# Patient Record
Sex: Male | Born: 1955 | Race: White | Hispanic: No | Marital: Single | Smoking: Former smoker
Health system: Southern US, Community
[De-identification: ages and names within clinical notes are randomized; demographics above are authoritative.]

## PROBLEM LIST (undated history)

## (undated) DIAGNOSIS — M3500 Sicca syndrome, unspecified: Secondary | ICD-10-CM

## (undated) DIAGNOSIS — Z8619 Personal history of other infectious and parasitic diseases: Secondary | ICD-10-CM

## (undated) DIAGNOSIS — I25709 Atherosclerosis of coronary artery bypass graft(s), unspecified, with unspecified angina pectoris: Secondary | ICD-10-CM

## (undated) DIAGNOSIS — F209 Schizophrenia, unspecified: Secondary | ICD-10-CM

## (undated) DIAGNOSIS — R079 Chest pain, unspecified: Secondary | ICD-10-CM

## (undated) DIAGNOSIS — F319 Bipolar disorder, unspecified: Secondary | ICD-10-CM

## (undated) DIAGNOSIS — F909 Attention-deficit hyperactivity disorder, unspecified type: Secondary | ICD-10-CM

## (undated) DIAGNOSIS — E039 Hypothyroidism, unspecified: Secondary | ICD-10-CM

## (undated) DIAGNOSIS — K219 Gastro-esophageal reflux disease without esophagitis: Secondary | ICD-10-CM

## (undated) DIAGNOSIS — Z0000 Encounter for general adult medical examination without abnormal findings: Secondary | ICD-10-CM

## (undated) DIAGNOSIS — D509 Iron deficiency anemia, unspecified: Secondary | ICD-10-CM

## (undated) HISTORY — PX: OTHER SURGICAL HISTORY: SHX169

## (undated) HISTORY — PX: HERNIA REPAIR: SHX51

---

## 1997-07-26 ENCOUNTER — Emergency Department (HOSPITAL_COMMUNITY): Admission: EM | Admit: 1997-07-26 | Discharge: 1997-07-26 | Payer: Self-pay | Admitting: Emergency Medicine

## 1998-05-28 ENCOUNTER — Emergency Department (HOSPITAL_COMMUNITY): Admission: EM | Admit: 1998-05-28 | Discharge: 1998-05-28 | Payer: Self-pay

## 1998-11-18 ENCOUNTER — Emergency Department (HOSPITAL_COMMUNITY): Admission: EM | Admit: 1998-11-18 | Discharge: 1998-11-18 | Payer: Self-pay | Admitting: Emergency Medicine

## 1999-06-15 ENCOUNTER — Emergency Department (HOSPITAL_COMMUNITY): Admission: EM | Admit: 1999-06-15 | Discharge: 1999-06-15 | Payer: Self-pay | Admitting: Emergency Medicine

## 1999-12-01 ENCOUNTER — Emergency Department (HOSPITAL_COMMUNITY): Admission: EM | Admit: 1999-12-01 | Discharge: 1999-12-01 | Payer: Self-pay | Admitting: *Deleted

## 2000-08-09 ENCOUNTER — Emergency Department (HOSPITAL_COMMUNITY): Admission: EM | Admit: 2000-08-09 | Discharge: 2000-08-09 | Payer: Self-pay | Admitting: Emergency Medicine

## 2000-09-13 ENCOUNTER — Emergency Department (HOSPITAL_COMMUNITY): Admission: EM | Admit: 2000-09-13 | Discharge: 2000-09-13 | Payer: Self-pay | Admitting: Emergency Medicine

## 2002-05-09 ENCOUNTER — Encounter: Payer: Self-pay | Admitting: Emergency Medicine

## 2002-05-09 ENCOUNTER — Emergency Department (HOSPITAL_COMMUNITY): Admission: EM | Admit: 2002-05-09 | Discharge: 2002-05-09 | Payer: Self-pay | Admitting: Emergency Medicine

## 2002-05-14 ENCOUNTER — Emergency Department (HOSPITAL_COMMUNITY): Admission: EM | Admit: 2002-05-14 | Discharge: 2002-05-14 | Payer: Self-pay | Admitting: *Deleted

## 2002-05-18 ENCOUNTER — Emergency Department (HOSPITAL_COMMUNITY): Admission: EM | Admit: 2002-05-18 | Discharge: 2002-05-18 | Payer: Self-pay | Admitting: Emergency Medicine

## 2004-10-22 ENCOUNTER — Emergency Department (HOSPITAL_COMMUNITY): Admission: EM | Admit: 2004-10-22 | Discharge: 2004-10-22 | Payer: Self-pay | Admitting: Emergency Medicine

## 2005-01-31 ENCOUNTER — Emergency Department (HOSPITAL_COMMUNITY): Admission: EM | Admit: 2005-01-31 | Discharge: 2005-01-31 | Payer: Self-pay | Admitting: Emergency Medicine

## 2005-02-15 ENCOUNTER — Emergency Department (HOSPITAL_COMMUNITY): Admission: EM | Admit: 2005-02-15 | Discharge: 2005-02-15 | Payer: Self-pay | Admitting: Emergency Medicine

## 2005-04-25 ENCOUNTER — Emergency Department (HOSPITAL_COMMUNITY): Admission: EM | Admit: 2005-04-25 | Discharge: 2005-04-25 | Payer: Self-pay | Admitting: Emergency Medicine

## 2005-07-13 ENCOUNTER — Emergency Department (HOSPITAL_COMMUNITY): Admission: EM | Admit: 2005-07-13 | Discharge: 2005-07-13 | Payer: Self-pay | Admitting: Emergency Medicine

## 2005-07-24 ENCOUNTER — Emergency Department (HOSPITAL_COMMUNITY): Admission: EM | Admit: 2005-07-24 | Discharge: 2005-07-24 | Payer: Self-pay | Admitting: Emergency Medicine

## 2005-08-05 ENCOUNTER — Emergency Department (HOSPITAL_COMMUNITY): Admission: EM | Admit: 2005-08-05 | Discharge: 2005-08-05 | Payer: Self-pay | Admitting: Emergency Medicine

## 2005-09-07 ENCOUNTER — Emergency Department (HOSPITAL_COMMUNITY): Admission: EM | Admit: 2005-09-07 | Discharge: 2005-09-07 | Payer: Self-pay | Admitting: Emergency Medicine

## 2006-05-31 ENCOUNTER — Emergency Department (HOSPITAL_COMMUNITY): Admission: EM | Admit: 2006-05-31 | Discharge: 2006-05-31 | Payer: Self-pay | Admitting: Emergency Medicine

## 2006-06-21 ENCOUNTER — Emergency Department (HOSPITAL_COMMUNITY): Admission: EM | Admit: 2006-06-21 | Discharge: 2006-06-21 | Payer: Self-pay | Admitting: Emergency Medicine

## 2006-08-04 ENCOUNTER — Emergency Department (HOSPITAL_COMMUNITY): Admission: EM | Admit: 2006-08-04 | Discharge: 2006-08-05 | Payer: Self-pay | Admitting: Emergency Medicine

## 2006-09-17 ENCOUNTER — Emergency Department (HOSPITAL_COMMUNITY): Admission: EM | Admit: 2006-09-17 | Discharge: 2006-09-17 | Payer: Self-pay | Admitting: Emergency Medicine

## 2006-09-28 ENCOUNTER — Emergency Department (HOSPITAL_COMMUNITY): Admission: EM | Admit: 2006-09-28 | Discharge: 2006-09-28 | Payer: Self-pay | Admitting: Emergency Medicine

## 2006-10-06 ENCOUNTER — Emergency Department (HOSPITAL_COMMUNITY): Admission: EM | Admit: 2006-10-06 | Discharge: 2006-10-06 | Payer: Self-pay | Admitting: Emergency Medicine

## 2006-10-13 ENCOUNTER — Emergency Department (HOSPITAL_COMMUNITY): Admission: EM | Admit: 2006-10-13 | Discharge: 2006-10-13 | Payer: Self-pay | Admitting: Emergency Medicine

## 2006-10-17 ENCOUNTER — Emergency Department (HOSPITAL_COMMUNITY): Admission: EM | Admit: 2006-10-17 | Discharge: 2006-10-17 | Payer: Self-pay | Admitting: Emergency Medicine

## 2006-10-25 ENCOUNTER — Emergency Department: Payer: Self-pay | Admitting: Emergency Medicine

## 2006-11-09 ENCOUNTER — Emergency Department (HOSPITAL_COMMUNITY): Admission: EM | Admit: 2006-11-09 | Discharge: 2006-11-09 | Payer: Self-pay | Admitting: Emergency Medicine

## 2006-11-15 ENCOUNTER — Emergency Department (HOSPITAL_COMMUNITY): Admission: EM | Admit: 2006-11-15 | Discharge: 2006-11-15 | Payer: Self-pay | Admitting: Emergency Medicine

## 2006-11-26 ENCOUNTER — Emergency Department (HOSPITAL_COMMUNITY): Admission: EM | Admit: 2006-11-26 | Discharge: 2006-11-26 | Payer: Self-pay | Admitting: Emergency Medicine

## 2006-11-30 ENCOUNTER — Emergency Department (HOSPITAL_COMMUNITY): Admission: EM | Admit: 2006-11-30 | Discharge: 2006-12-01 | Payer: Self-pay | Admitting: Emergency Medicine

## 2006-12-08 ENCOUNTER — Emergency Department (HOSPITAL_COMMUNITY): Admission: EM | Admit: 2006-12-08 | Discharge: 2006-12-08 | Payer: Self-pay | Admitting: Emergency Medicine

## 2006-12-09 ENCOUNTER — Emergency Department: Payer: Self-pay | Admitting: Emergency Medicine

## 2006-12-18 ENCOUNTER — Emergency Department (HOSPITAL_COMMUNITY): Admission: EM | Admit: 2006-12-18 | Discharge: 2006-12-18 | Payer: Self-pay | Admitting: Emergency Medicine

## 2007-01-04 ENCOUNTER — Encounter: Payer: Self-pay | Admitting: Cardiology

## 2007-01-05 ENCOUNTER — Inpatient Hospital Stay (HOSPITAL_COMMUNITY): Admission: EM | Admit: 2007-01-05 | Discharge: 2007-01-07 | Payer: Self-pay | Admitting: Cardiology

## 2007-01-09 ENCOUNTER — Emergency Department (HOSPITAL_COMMUNITY): Admission: EM | Admit: 2007-01-09 | Discharge: 2007-01-09 | Payer: Self-pay | Admitting: Emergency Medicine

## 2007-01-15 ENCOUNTER — Emergency Department (HOSPITAL_COMMUNITY): Admission: EM | Admit: 2007-01-15 | Discharge: 2007-01-15 | Payer: Self-pay | Admitting: Emergency Medicine

## 2007-01-25 ENCOUNTER — Emergency Department (HOSPITAL_COMMUNITY): Admission: EM | Admit: 2007-01-25 | Discharge: 2007-01-26 | Payer: Self-pay | Admitting: Emergency Medicine

## 2007-01-28 ENCOUNTER — Emergency Department (HOSPITAL_COMMUNITY): Admission: EM | Admit: 2007-01-28 | Discharge: 2007-01-29 | Payer: Self-pay | Admitting: Emergency Medicine

## 2007-02-02 ENCOUNTER — Emergency Department (HOSPITAL_COMMUNITY): Admission: EM | Admit: 2007-02-02 | Discharge: 2007-02-02 | Payer: Self-pay | Admitting: Emergency Medicine

## 2007-02-04 ENCOUNTER — Emergency Department (HOSPITAL_COMMUNITY): Admission: EM | Admit: 2007-02-04 | Discharge: 2007-02-05 | Payer: Self-pay | Admitting: Emergency Medicine

## 2007-02-18 ENCOUNTER — Emergency Department (HOSPITAL_COMMUNITY): Admission: EM | Admit: 2007-02-18 | Discharge: 2007-02-18 | Payer: Self-pay | Admitting: Emergency Medicine

## 2007-03-20 ENCOUNTER — Emergency Department (HOSPITAL_COMMUNITY): Admission: EM | Admit: 2007-03-20 | Discharge: 2007-03-20 | Payer: Self-pay | Admitting: Emergency Medicine

## 2008-03-24 ENCOUNTER — Emergency Department: Payer: Self-pay | Admitting: Unknown Physician Specialty

## 2008-04-05 ENCOUNTER — Emergency Department: Payer: Self-pay | Admitting: Emergency Medicine

## 2008-11-08 IMAGING — CR DG CHEST 1V PORT
1 series · 1 of 1 positions shown · non-contrast
Comparison: 01/04/07.

CLINICAL DATA: Chest pain.  
 PORTABLE CHEST - 1 VIEW 01/04/07:

[view not recorded]
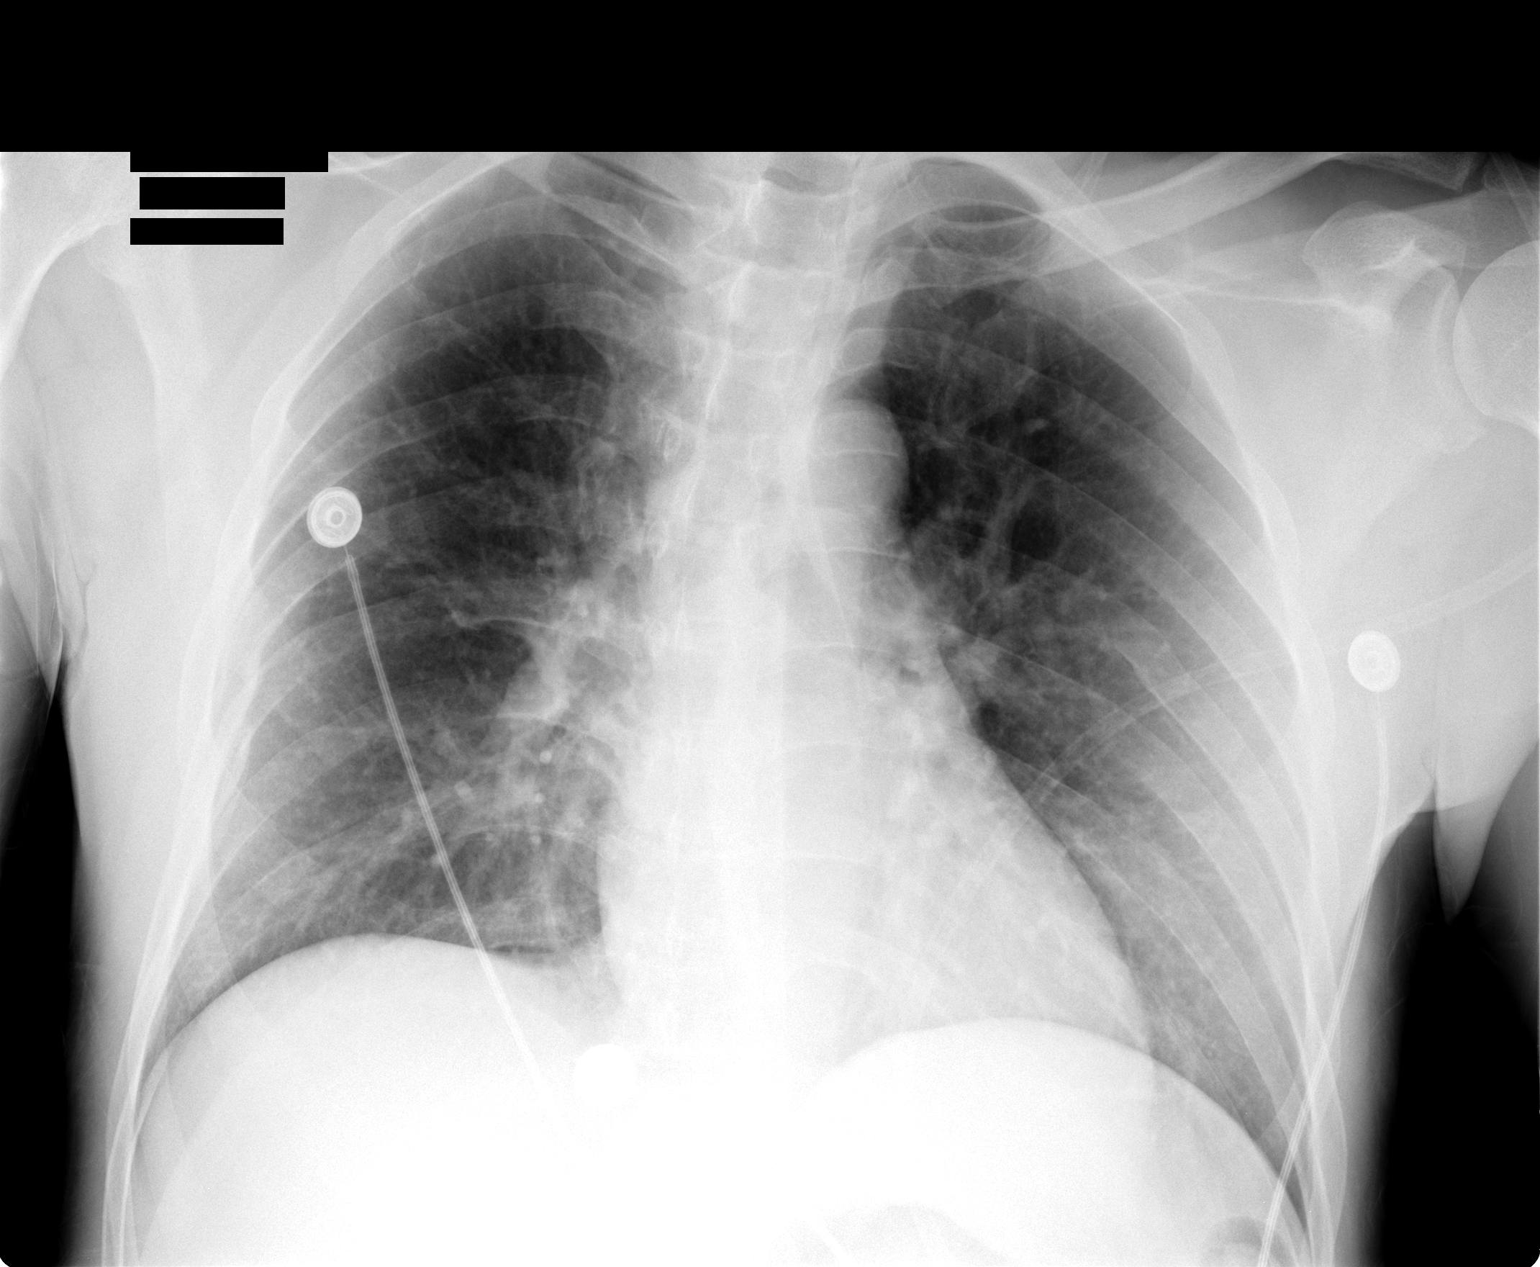

[1 of 1 positions shown; findings below may reference images not displayed]

FINDINGS: Lungs are clear.  Heart size normal.  No effusion or focal bony abnormality.
IMPRESSION: No acute disease.

## 2008-11-08 IMAGING — CT CT ANGIO CHEST
2 of 4 series · 19 of 36 positions shown · IV contrast (APPLIED)
Comparison: Plain film of 01/04/07.

CLINICAL DATA: Chest pain, shortness of breath.
 CT ANGIOGRAPHY OF CHEST:
TECHNIQUE: Multidetector CT imaging of the chest was performed during bolus injection of intravenous contrast.  Multiplanar CT angiographic image reconstructions were generated to evaluate the vascular anatomy.
 Contrast:  80 cc of Omnipaque 300.

[Series 7: pe 1.0 b40f thins for pacs · axial · 0.62mm/px · z∈[-290,-36]mm · 16 of 284 slices shown]
[im 15/284  lung]
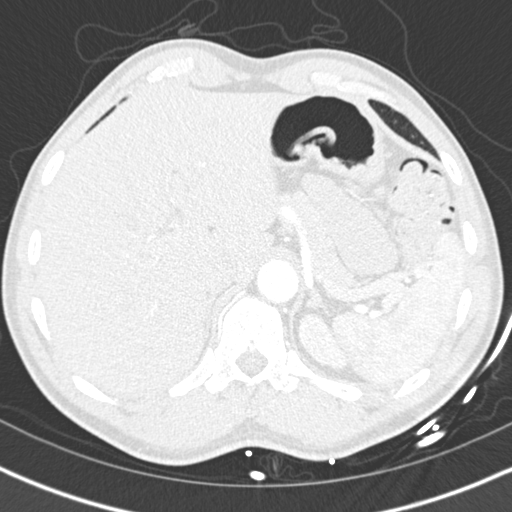
[im 29/284  mediastinal]
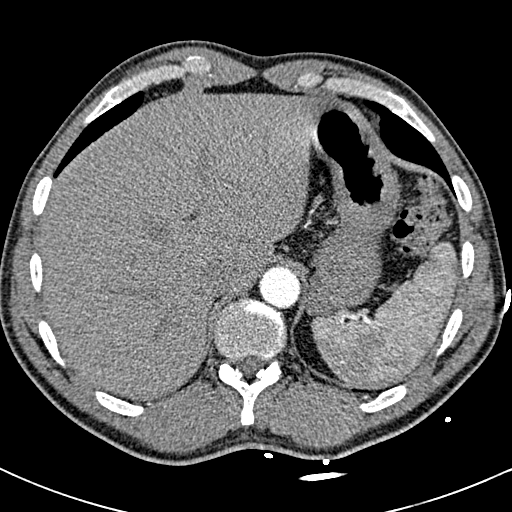
[im 43/284  lung]
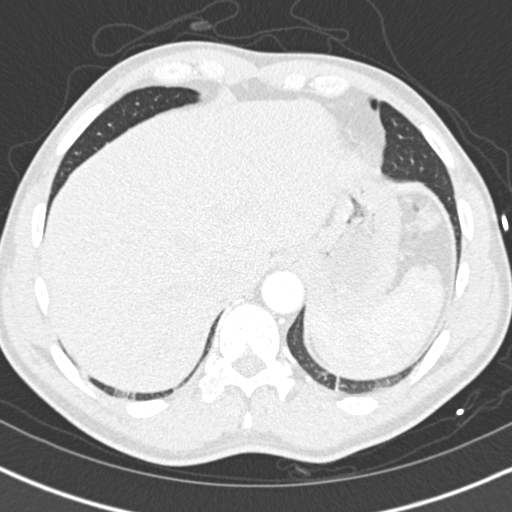
[im 71/284  mediastinal]
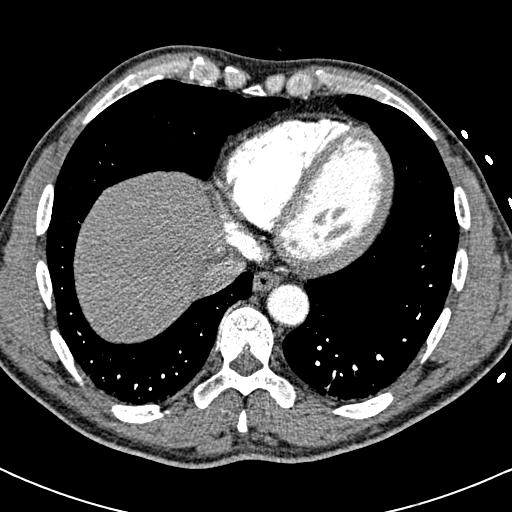
[im 85/284  lung]
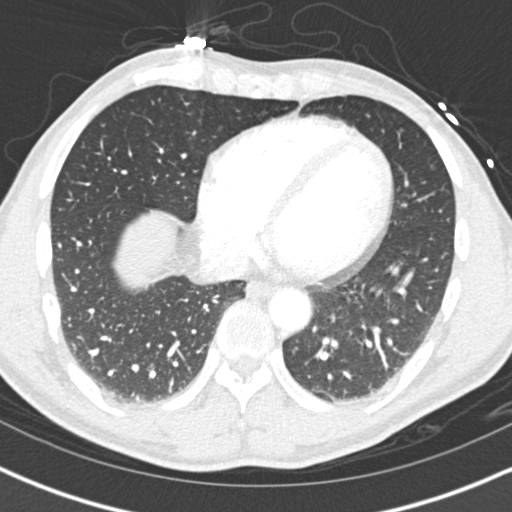
[im 100/284  mediastinal]
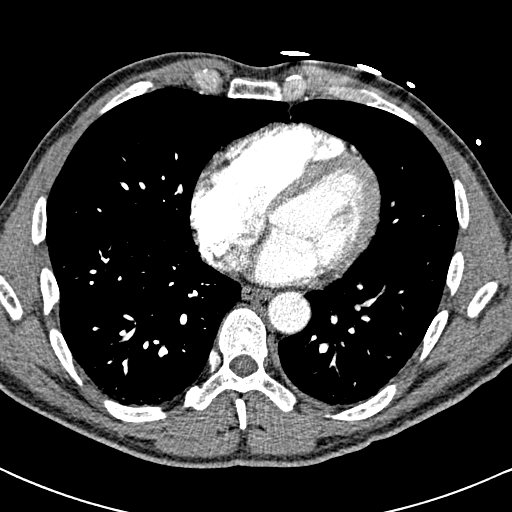
[im 114/284  lung]
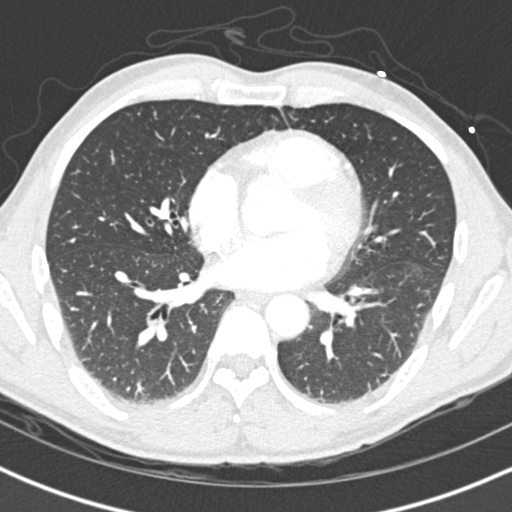
[im 128/284  mediastinal]
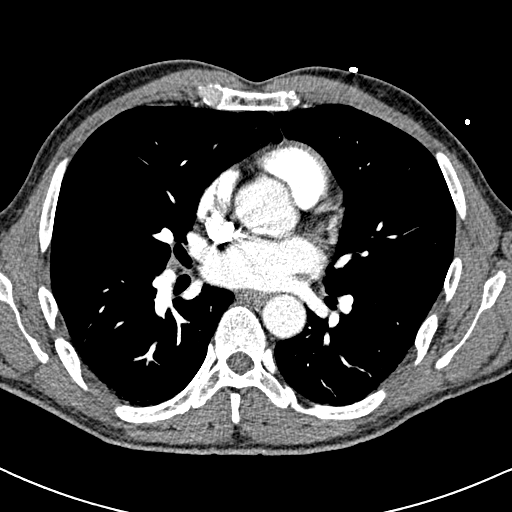
[im 156/284  lung]
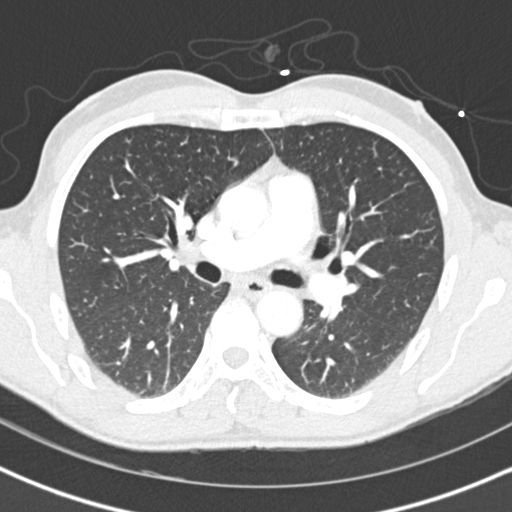
[im 170/284  mediastinal]
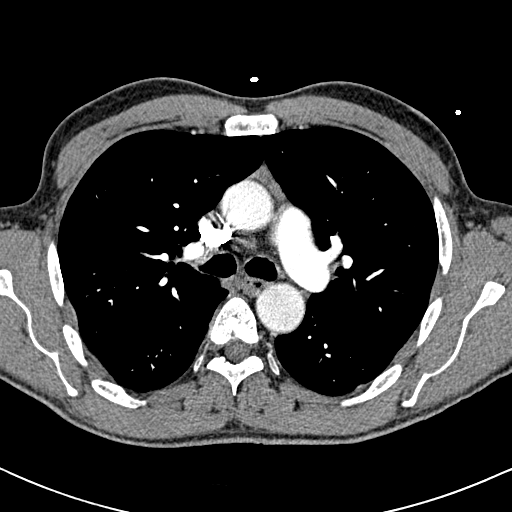
[im 184/284  lung]
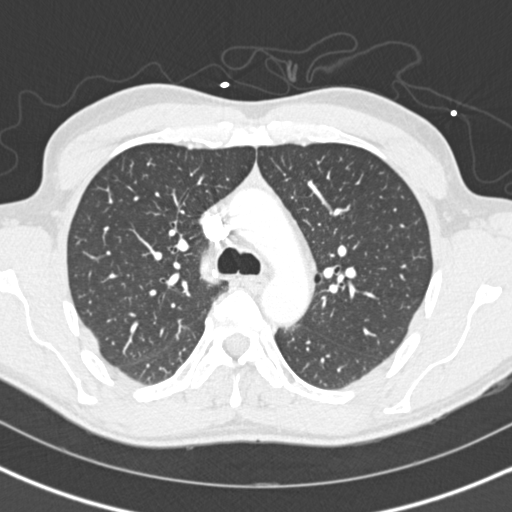
[im 199/284  mediastinal]
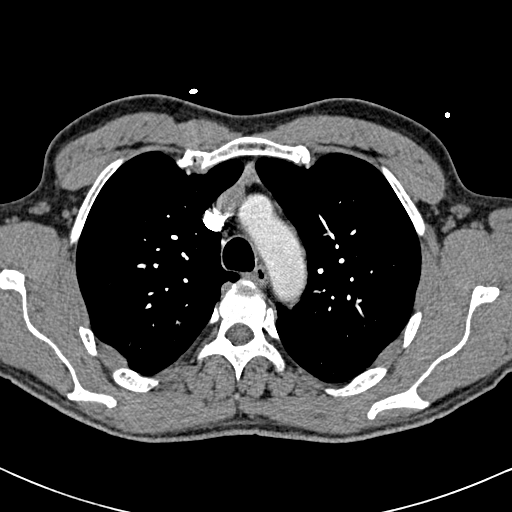
[im 213/284  lung]
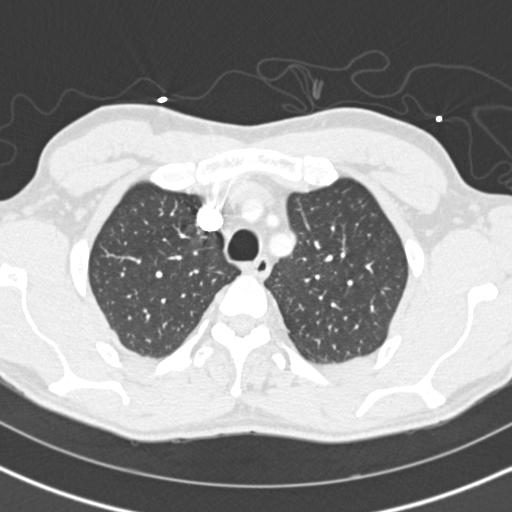
[im 241/284  mediastinal]
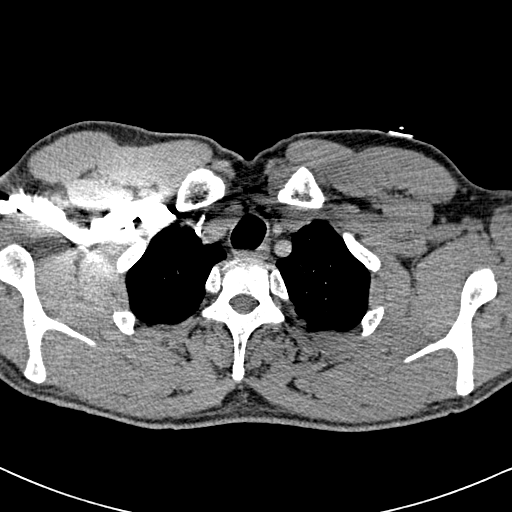
[im 255/284  lung]
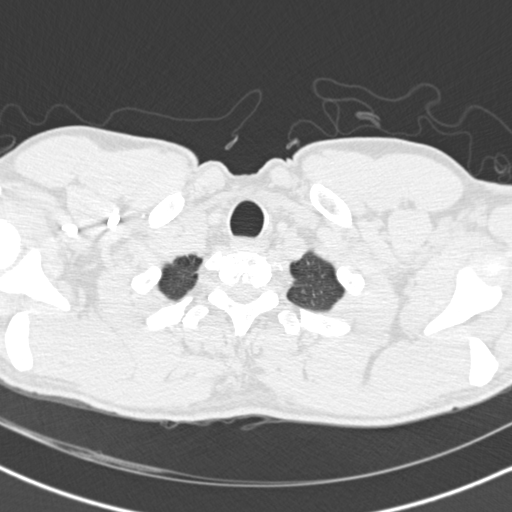
[im 269/284  mediastinal]
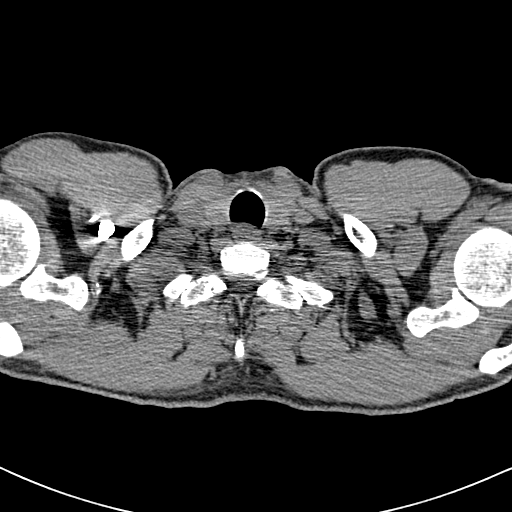

[Series 602: <mpr range> · coronal · 0.62mm/px · 3 of 113 slices shown]
[im 23/113  mediastinal]
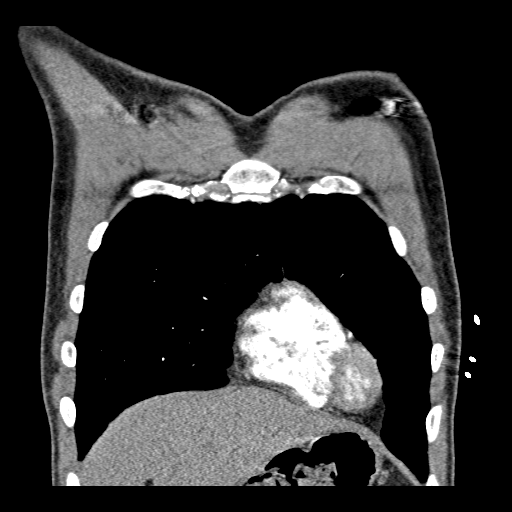
[im 45/113  mediastinal]
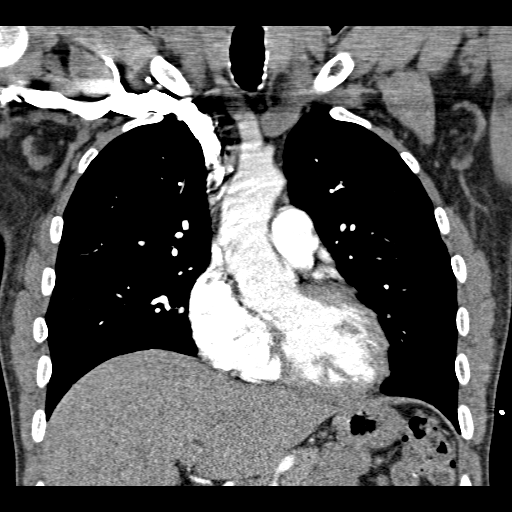
[im 68/113  mediastinal]
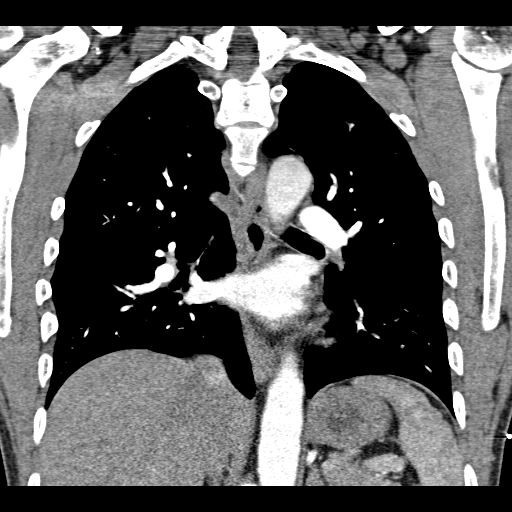

[19 of 36 positions shown; findings below may reference images not displayed]

FINDINGS: The study is technically good.  There is no CT evidence of pulmonary embolus.  No axillary, hilar or mediastinal lymphadenopathy.  No pleural or pericardial effusion.  The heart and great vessels appear normal.  No aortic dissection or aneurysm is noted.  No notable atherosclerotic vascular disease.  The lungs demonstrate only mild dependent atelectatic change.  Incidentally imaged upper abdomen is unremarkable.  No focal bony abnormality.
IMPRESSION: 1. Negative chest CT.  
 2. No acute findings.
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
## 2010-11-13 LAB — CBC
MCHC: 34.5
MCV: 94.5
Platelets: 260
RDW: 13.3

## 2010-11-13 LAB — COMPREHENSIVE METABOLIC PANEL
ALT: 17
AST: 29
Albumin: 3.7
CO2: 26
Creatinine, Ser: 1.18
GFR calc Af Amer: >60
GFR calc non Af Amer: >60
Glucose, Bld: 89
Potassium: 3.8
Sodium: 140
Total Bilirubin: 0.9
Total Protein: 6.2

## 2010-11-13 LAB — DIFFERENTIAL
Eosinophils Absolute: 0.1
Monocytes Relative: 7

## 2010-11-13 LAB — URINALYSIS, ROUTINE W REFLEX MICROSCOPIC
Glucose, UA: NEGATIVE
Hgb urine dipstick: NEGATIVE
Urobilinogen, UA: 1
pH: 5.5

## 2010-11-28 LAB — DIFFERENTIAL
Basophils Absolute: 0
Lymphocytes Relative: 44
Lymphs Abs: 3
Monocytes Absolute: 0.4
Monocytes Relative: 6
Neutro Abs: 3.1

## 2010-11-28 LAB — BASIC METABOLIC PANEL
Calcium: 9.3
GFR calc Af Amer: >60
GFR calc non Af Amer: >60
Potassium: 3.7
Sodium: 143

## 2010-11-28 LAB — CBC
HCT: 42.1
Hemoglobin: 14.5
RBC: 4.41
RDW: 13.2
WBC: 6.9

## 2010-11-28 LAB — URINALYSIS, ROUTINE W REFLEX MICROSCOPIC
Glucose, UA: NEGATIVE
Hgb urine dipstick: NEGATIVE
Ketones, ur: NEGATIVE
Protein, ur: NEGATIVE
Urobilinogen, UA: 0.2

## 2010-11-28 LAB — RAPID URINE DRUG SCREEN, HOSP PERFORMED
Amphetamines: NOT DETECTED
Benzodiazepines: POSITIVE — AB
Cocaine: POSITIVE — AB

## 2010-12-01 LAB — CBC
HCT: 42.1
Hemoglobin: 14.8
MCHC: 35
MCV: 95.3
Platelets: 277
RBC: 4.42
RDW: 13.2
WBC: 4.7

## 2010-12-01 LAB — DIFFERENTIAL
Basophils Absolute: 0
Basophils Relative: 1
Eosinophils Absolute: 0.1 — ABNORMAL LOW
Eosinophils Relative: 2
Lymphocytes Relative: 32
Lymphs Abs: 1.5
Monocytes Absolute: 0.4
Monocytes Relative: 8
Neutro Abs: 2.7
Neutrophils Relative %: 57

## 2010-12-01 LAB — BASIC METABOLIC PANEL WITH GFR
CO2: 28
Calcium: 9.8
Creatinine, Ser: 1.02
GFR calc Af Amer: >60
GFR calc non Af Amer: >60
Sodium: 143

## 2010-12-01 LAB — BASIC METABOLIC PANEL
BUN: 10
Chloride: 106
Glucose, Bld: 124 — ABNORMAL HIGH
Potassium: 3.4 — ABNORMAL LOW

## 2010-12-01 LAB — URINALYSIS, ROUTINE W REFLEX MICROSCOPIC
Bilirubin Urine: NEGATIVE
Glucose, UA: NEGATIVE
Ketones, ur: NEGATIVE
pH: 7.5

## 2010-12-01 LAB — POCT CARDIAC MARKERS
CKMB, poc: 1
Troponin i, poc: <0.05
Troponin i, poc: <0.05

## 2010-12-01 LAB — D-DIMER, QUANTITATIVE: D-Dimer, Quant: 1.1 — ABNORMAL HIGH

## 2010-12-02 LAB — CBC
HCT: 37.8 — ABNORMAL LOW
Hemoglobin: 13
Hemoglobin: 14.6
Hemoglobin: 13.4
Hemoglobin: 14.6
MCHC: 34
RBC: 3.93 — ABNORMAL LOW
RBC: 4.39
RBC: 4.06 — ABNORMAL LOW
RBC: 4.42
RDW: 13.4
RDW: 13.4
RDW: 13.4
RDW: 13.5
WBC: 4.8

## 2010-12-02 LAB — CSF CELL COUNT WITH DIFFERENTIAL
RBC Count, CSF: 0
Tube #: 4
WBC, CSF: 1

## 2010-12-02 LAB — TROPONIN I
Troponin I: 0.02
Troponin I: <0.01

## 2010-12-02 LAB — DIFFERENTIAL
Basophils Absolute: 0
Basophils Relative: 1
Eosinophils Relative: 2
Lymphocytes Relative: 20
Lymphocytes Relative: 27
Lymphocytes Relative: 29
Lymphs Abs: 2.2
Lymphs Abs: 1.5
Monocytes Absolute: 0.3
Monocytes Absolute: 0.5
Monocytes Absolute: 0.3
Monocytes Relative: 6
Monocytes Relative: 6
Neutro Abs: 4.3
Neutro Abs: 3.3

## 2010-12-02 LAB — I-STAT 8, (EC8 V) (CONVERTED LAB)
BUN: 15
Chloride: 106
Glucose, Bld: 85
Hemoglobin: 13.3
Potassium: 3.6
Sodium: 143

## 2010-12-02 LAB — CSF CULTURE W GRAM STAIN: Culture: NO GROWTH

## 2010-12-02 LAB — CK TOTAL AND CKMB (NOT AT ARMC)
CK, MB: 1.5
CK, MB: 1.8
CK, MB: 1.5
Relative Index: 1.5
Relative Index: INVALID
Total CK: 68
Total CK: 101
Total CK: 81

## 2010-12-02 LAB — COMPREHENSIVE METABOLIC PANEL
Albumin: 4
Alkaline Phosphatase: 53
BUN: 10
Calcium: 9.2
Potassium: 4
Sodium: 137
Total Protein: 6.6

## 2010-12-02 LAB — PROTEIN, CSF: Total  Protein, CSF: 53 — ABNORMAL HIGH

## 2010-12-02 LAB — LIPID PANEL
Cholesterol: 158
LDL Cholesterol: 105 — ABNORMAL HIGH
Triglycerides: 85

## 2010-12-02 LAB — BASIC METABOLIC PANEL
Calcium: 8.6
Creatinine, Ser: 1.01
GFR calc Af Amer: >60
GFR calc non Af Amer: >60
Glucose, Bld: 101 — ABNORMAL HIGH
Sodium: 138

## 2010-12-02 LAB — APTT: aPTT: 31

## 2010-12-02 LAB — PROTIME-INR
INR: 0.9
Prothrombin Time: 12.8

## 2010-12-02 LAB — C-REACTIVE PROTEIN: CRP: 0 — ABNORMAL LOW (ref <0.6)

## 2010-12-02 LAB — HEPARIN LEVEL (UNFRACTIONATED): Heparin Unfractionated: 0.73 — ABNORMAL HIGH

## 2010-12-04 LAB — BASIC METABOLIC PANEL
BUN: 12
CO2: 23
Calcium: 8.9
Chloride: 106
Creatinine, Ser: 1.06
Glucose, Bld: 170 — ABNORMAL HIGH

## 2010-12-04 LAB — CBC
MCHC: 35
MCV: 93.9
RDW: 13.3

## 2010-12-04 LAB — DIFFERENTIAL
Basophils Absolute: 0
Basophils Relative: 1
Eosinophils Absolute: 0.1
Monocytes Relative: 6
Neutro Abs: 2.4
Neutrophils Relative %: 56

## 2010-12-04 LAB — POCT CARDIAC MARKERS
CKMB, poc: 3
Myoglobin, poc: 103

## 2010-12-11 LAB — URINALYSIS, ROUTINE W REFLEX MICROSCOPIC
Bilirubin Urine: NEGATIVE
Glucose, UA: NEGATIVE
Hgb urine dipstick: NEGATIVE
Protein, ur: NEGATIVE
Specific Gravity, Urine: 1.015
Urobilinogen, UA: 0.2

## 2015-05-05 ENCOUNTER — Encounter: Payer: Self-pay | Admitting: Emergency Medicine

## 2015-05-05 ENCOUNTER — Emergency Department
Admission: EM | Admit: 2015-05-05 | Discharge: 2015-05-05 | Disposition: A | Discharge status: Home / Self Care | Attending: Emergency Medicine | Admitting: Emergency Medicine

## 2015-05-05 ENCOUNTER — Emergency Department

## 2015-05-05 DIAGNOSIS — R079 Chest pain, unspecified: Secondary | ICD-10-CM

## 2015-05-05 DIAGNOSIS — F329 Major depressive disorder, single episode, unspecified: Secondary | ICD-10-CM | POA: Diagnosis not present

## 2015-05-05 DIAGNOSIS — Z9861 Coronary angioplasty status: Secondary | ICD-10-CM | POA: Diagnosis not present

## 2015-05-05 HISTORY — DX: Attention-deficit hyperactivity disorder, unspecified type: F90.9

## 2015-05-05 HISTORY — DX: Chest pain, unspecified: R07.9

## 2015-05-05 HISTORY — DX: Schizophrenia, unspecified: F20.9

## 2015-05-05 HISTORY — DX: Bipolar disorder, unspecified: F31.9

## 2015-05-05 LAB — CBC
HCT: 36.4 % — ABNORMAL LOW (ref 40.0–52.0)
Hemoglobin: 12.6 g/dL — ABNORMAL LOW (ref 13.0–18.0)
MCH: 30.7 pg (ref 26.0–34.0)
MCHC: 34.5 g/dL (ref 32.0–36.0)
MCV: 88.8 fL (ref 80.0–100.0)
PLATELETS: 191 10*3/uL (ref 150–440)
RBC: 4.1 MIL/uL — AB (ref 4.40–5.90)
RDW: 15.7 % — ABNORMAL HIGH (ref 11.5–14.5)
WBC: 3.9 10*3/uL (ref 3.8–10.6)

## 2015-05-05 LAB — COMPREHENSIVE METABOLIC PANEL
ALBUMIN: 3.7 g/dL (ref 3.5–5.0)
ALK PHOS: 59 U/L (ref 38–126)
ALT: 14 U/L — ABNORMAL LOW (ref 17–63)
AST: 19 U/L (ref 15–41)
Anion gap: 5 (ref 5–15)
BILIRUBIN TOTAL: 0.4 mg/dL (ref 0.3–1.2)
BUN: 15 mg/dL (ref 6–20)
CALCIUM: 8.6 mg/dL — AB (ref 8.9–10.3)
CO2: 27 mmol/L (ref 22–32)
Chloride: 107 mmol/L (ref 101–111)
Creatinine, Ser: 1.12 mg/dL (ref 0.61–1.24)
GFR calc Af Amer: >60 mL/min (ref >60)
GFR calc non Af Amer: >60 mL/min (ref >60)
GLUCOSE: 97 mg/dL (ref 65–99)
POTASSIUM: 3.8 mmol/L (ref 3.5–5.1)
Sodium: 139 mmol/L (ref 135–145)
TOTAL PROTEIN: 7.3 g/dL (ref 6.5–8.1)

## 2015-05-05 LAB — TROPONIN I: Troponin I: <0.03 ng/mL (ref <0.031)

## 2015-05-05 MED ORDER — IBUPROFEN 800 MG PO TABS: 800.0000 mg | ORAL_TABLET | Freq: Three times a day (TID) | ORAL | Status: AC | PRN

## 2015-05-05 MED ORDER — MORPHINE SULFATE (PF) 4 MG/ML IV SOLN
4.0000 mg | Freq: Once | INTRAVENOUS | Status: AC
  Administered 2015-05-05: 4 mg via INTRAVENOUS
  Filled 2015-05-05: qty 1

## 2015-05-05 MED ORDER — LORAZEPAM 2 MG/ML IJ SOLN
1.0000 mg | Freq: Once | INTRAMUSCULAR | Status: AC
  Administered 2015-05-05: 1 mg via INTRAVENOUS
  Filled 2015-05-05: qty 1

## 2015-05-05 NOTE — ED Provider Notes (Signed)
[facility] Emergency Department Provider Note     Time seen: ----------------------------------------- 3:01 PM on 05/05/2015 -----------------------------------------    I have reviewed the triage vital signs and the nursing notes.   HISTORY  Chief Complaint No chief complaint on file.    HPI Joseph Pierce is a 60 y.o. male who presents ER being brought from jail for left upper chest pain. Patient states he had pain similar to this before he had a stent placed. Patient's had this pain for several days, received aspirin and nitroglycerin in route by EMS without any improvement. Nothing makes his symptoms worse. He has not had associated symptoms such as nausea or sweats.   No past medical history on file.  There are no active problems to display for this patient.   No past surgical history on file.  Allergies Review of patient's allergies indicates not on file.  Social History Social History  Substance Use Topics  . Smoking status: Not on file  . Smokeless tobacco: Not on file  . Alcohol Use: Not on file    Review of Systems Constitutional: Negative for fever. Eyes: Negative for visual changes. ENT: Negative for sore throat. Cardiovascular: Positive for chest pain Respiratory: Negative for shortness of breath. Gastrointestinal: Negative for abdominal pain, vomiting and diarrhea. Genitourinary: Negative for dysuria. Musculoskeletal: Negative for back pain. Skin: Negative for rash. Neurological: Negative for headaches, focal weakness or numbness.  10-point ROS otherwise negative.  ____________________________________________   PHYSICAL EXAM:  VITAL SIGNS: ED Triage Vitals  Enc Vitals Group     BP --      Pulse --      Resp --      Temp --      Temp src --      SpO2 --      Weight --      Height --      Head Cir --      Peak Flow --      Pain Score --      Pain Loc --      Pain Edu? --      Excl. in GC? --      Constitutional: Alert and oriented. Mild distress Eyes: Conjunctivae are normal. PERRL. Normal extraocular movements. ENT   Head: Normocephalic and atraumatic.   Nose: No congestion/rhinnorhea.   Mouth/Throat: Mucous membranes are moist.   Neck: No stridor. Cardiovascular: Normal rate, regular rhythm. Normal and symmetric distal pulses are present in all extremities. No murmurs, rubs, or gallops. Respiratory: Normal respiratory effort without tachypnea nor retractions. Breath sounds are clear and equal bilaterally. No wheezes/rales/rhonchi. Gastrointestinal: Soft and nontender. No distention. No abdominal bruits.  Musculoskeletal: Nontender with normal range of motion in all extremities. No joint effusions.  No lower extremity tenderness nor edema. Neurologic:  Normal speech and language. No gross focal neurologic deficits are appreciated. Speech is normal. No gait instability. Skin:  Skin is warm, dry and intact. No rash noted. Psychiatric: Depressed mood and affect ____________________________________________  EKG: Interpreted by me. Sinus rhythm rate of 69 bpm, normal PR interval, normal QRS, normal QT interval. Baseline artifact. Normal axis. Possible inferior infarct age indeterminate  ____________________________________________  ED COURSE:  Pertinent labs & imaging results that were available during my care of the patient were reviewed by me and considered in my medical decision making (see chart for details). Patient appears to be uncomfortable, we'll check cardiac labs and chest x-ray. ____________________________________________    LABS (pertinent positives/negatives)  Labs  Reviewed  CBC - Abnormal; Notable for the following:    RBC 4.10 (*)    Hemoglobin 12.6 (*)    HCT 36.4 (*)    RDW 15.7 (*)    All other components within normal limits  COMPREHENSIVE METABOLIC PANEL - Abnormal; Notable for the following:    Calcium 8.6 (*)    ALT 14 (*)    All  other components within normal limits  TROPONIN I  TROPONIN I    RADIOLOGY  Chest x-ray IMPRESSION: No radiographic evidence of acute cardiopulmonary disease. ____________________________________________  FINAL ASSESSMENT AND PLAN  Chest pain  Plan: Patient with labs and imaging as dictated above. Patient with unclear etiology for his chest pain. He is in no acute distress, serial troponins are negative. He is advised to call his cardiologist tomorrow which he agrees to do.   Emily Filbert, MD   Emily Filbert, MD 05/05/15 [phone]

## 2015-05-05 NOTE — Discharge Instructions (Signed)
Nonspecific Chest Pain  °Chest pain can be caused by many different conditions. There is always a chance that your pain could be related to something serious, such as a heart attack or a blood clot in your lungs. Chest pain can also be caused by conditions that are not life-threatening. If you have chest pain, it is very important to follow up with your health care provider. °CAUSES  °Chest pain can be caused by: °· Heartburn. °· Pneumonia or bronchitis. °· Anxiety or stress. °· Inflammation around your heart (pericarditis) or lung (pleuritis or pleurisy). °· A blood clot in your lung. °· A collapsed lung (pneumothorax). It can develop suddenly on its own (spontaneous pneumothorax) or from trauma to the chest. °· Shingles infection (varicella-zoster virus). °· Heart attack. °· Damage to the bones, muscles, and cartilage that make up your chest wall. This can include: °¨ Bruised bones due to injury. °¨ Strained muscles or cartilage due to frequent or repeated coughing or overwork. °¨ Fracture to one or more ribs. °¨ Sore cartilage due to inflammation (costochondritis). °RISK FACTORS  °Risk factors for chest pain may include: °· Activities that increase your risk for trauma or injury to your chest. °· Respiratory infections or conditions that cause frequent coughing. °· Medical conditions or overeating that can cause heartburn. °· Heart disease or family history of heart disease. °· Conditions or health behaviors that increase your risk of developing a blood clot. °· Having had chicken pox (varicella zoster). °SIGNS AND SYMPTOMS °Chest pain can feel like: °· Burning or tingling on the surface of your chest or deep in your chest. °· Crushing, pressure, aching, or squeezing pain. °· Dull or sharp pain that is worse when you move, cough, or take a deep breath. °· Pain that is also felt in your back, neck, shoulder, or arm, or pain that spreads to any of these areas. °Your chest pain may come and go, or it may stay  constant. °DIAGNOSIS °Lab tests or other studies may be needed to find the cause of your pain. Your health care provider may have you take a test called an ambulatory ECG (electrocardiogram). An ECG records your heartbeat patterns at the time the test is performed. You may also have other tests, such as: °· Transthoracic echocardiogram (TTE). During echocardiography, sound waves are used to create a picture of all of the heart structures and to look at how blood flows through your heart. °· Transesophageal echocardiogram (TEE). This is a more advanced imaging test that obtains images from inside your body. It allows your health care provider to see your heart in finer detail. °· Cardiac monitoring. This allows your health care provider to monitor your heart rate and rhythm in real time. °· Holter monitor. This is a portable device that records your heartbeat and can help to diagnose abnormal heartbeats. It allows your health care provider to track your heart activity for several days, if needed. °· Stress tests. These can be done through exercise or by taking medicine that makes your heart beat more quickly. °· Blood tests. °· Imaging tests. °TREATMENT  °Your treatment depends on what is causing your chest pain. Treatment may include: °· Medicines. These may include: °¨ Acid blockers for heartburn. °¨ Anti-inflammatory medicine. °¨ Pain medicine for inflammatory conditions. °¨ Antibiotic medicine, if an infection is present. °¨ Medicines to dissolve blood clots. °¨ Medicines to treat coronary artery disease. °· Supportive care for conditions that do not require medicines. This may include: °¨ Resting. °¨ Applying heat   or cold packs to injured areas. °¨ Limiting activities until pain decreases. °HOME CARE INSTRUCTIONS °· If you were prescribed an antibiotic medicine, finish it all even if you start to feel better. °· Avoid any activities that bring on chest pain. °· Do not use any tobacco products, including  cigarettes, chewing tobacco, or electronic cigarettes. If you need help quitting, ask your health care provider. °· Do not drink alcohol. °· Take medicines only as directed by your health care provider. °· Keep all follow-up visits as directed by your health care provider. This is important. This includes any further testing if your chest pain does not go away. °· If heartburn is the cause for your chest pain, you may be told to keep your head raised (elevated) while sleeping. This reduces the chance that acid will go from your stomach into your esophagus. °· Make lifestyle changes as directed by your health care provider. These may include: °¨ Getting regular exercise. Ask your health care provider to suggest some activities that are safe for you. °¨ Eating a heart-healthy diet. A registered dietitian can help you to learn healthy eating options. °¨ Maintaining a healthy weight. °¨ Managing diabetes, if necessary. °¨ Reducing stress. °SEEK MEDICAL CARE IF: °· Your chest pain does not go away after treatment. °· You have a rash with blisters on your chest. °· You have a fever. °SEEK IMMEDIATE MEDICAL CARE IF:  °· Your chest pain is worse. °· You have an increasing cough, or you cough up blood. °· You have severe abdominal pain. °· You have severe weakness. °· You faint. °· You have chills. °· You have sudden, unexplained chest discomfort. °· You have sudden, unexplained discomfort in your arms, back, neck, or jaw. °· You have shortness of breath at any time. °· You suddenly start to sweat, or your skin gets clammy. °· You feel nauseous or you vomit. °· You suddenly feel light-headed or dizzy. °· Your heart begins to beat quickly, or it feels like it is skipping beats. °These symptoms may represent a serious problem that is an emergency. Do not wait to see if the symptoms will go away. Get medical help right away. Call your local emergency services (911 in the [place] Do not drive yourself to the hospital. °  °This  information is not intended to replace advice given to you by your health care provider. Make sure you discuss any questions you have with your health care provider. °  °Document Released: 11/19/2004 Document Revised: 03/02/2014 Document Reviewed: 09/15/2013 °Elsevier Interactive Patient Education ©2016 [employer] ° °

## 2015-05-05 NOTE — ED Notes (Addendum)
Was given asa and 2 nitro at jail [facility]. This pt is not behavioural - medical

## 2015-05-05 NOTE — ED Notes (Signed)
Pt c/o chest pain x 1 day, off psych meds x 2 days while in jail.

## 2015-06-10 ENCOUNTER — Emergency Department
Admission: EM | Admit: 2015-06-10 | Discharge: 2015-06-10 | Attending: Emergency Medicine | Admitting: Emergency Medicine

## 2015-06-10 ENCOUNTER — Emergency Department

## 2015-06-10 DIAGNOSIS — F319 Bipolar disorder, unspecified: Secondary | ICD-10-CM | POA: Diagnosis not present

## 2015-06-10 DIAGNOSIS — Z87891 Personal history of nicotine dependence: Secondary | ICD-10-CM | POA: Diagnosis not present

## 2015-06-10 DIAGNOSIS — Z791 Long term (current) use of non-steroidal anti-inflammatories (NSAID): Secondary | ICD-10-CM | POA: Diagnosis not present

## 2015-06-10 DIAGNOSIS — F909 Attention-deficit hyperactivity disorder, unspecified type: Secondary | ICD-10-CM | POA: Diagnosis not present

## 2015-06-10 DIAGNOSIS — R079 Chest pain, unspecified: Secondary | ICD-10-CM

## 2015-06-10 DIAGNOSIS — F209 Schizophrenia, unspecified: Secondary | ICD-10-CM | POA: Insufficient documentation

## 2015-06-10 DIAGNOSIS — R0789 Other chest pain: Secondary | ICD-10-CM | POA: Insufficient documentation

## 2015-06-10 LAB — COMPREHENSIVE METABOLIC PANEL
ALBUMIN: 3.9 g/dL (ref 3.5–5.0)
ALK PHOS: 72 U/L (ref 38–126)
ALT: 13 U/L — AB (ref 17–63)
AST: 21 U/L (ref 15–41)
Anion gap: 3 — ABNORMAL LOW (ref 5–15)
BUN: 13 mg/dL (ref 6–20)
CALCIUM: 8.6 mg/dL — AB (ref 8.9–10.3)
CO2: 27 mmol/L (ref 22–32)
CREATININE: 1.05 mg/dL (ref 0.61–1.24)
Chloride: 106 mmol/L (ref 101–111)
GFR calc Af Amer: >60 mL/min (ref >60)
GFR calc non Af Amer: >60 mL/min (ref >60)
Glucose, Bld: 120 mg/dL — ABNORMAL HIGH (ref 65–99)
Potassium: 3.7 mmol/L (ref 3.5–5.1)
SODIUM: 136 mmol/L (ref 135–145)
TOTAL PROTEIN: 7.6 g/dL (ref 6.5–8.1)
Total Bilirubin: 0.6 mg/dL (ref 0.3–1.2)

## 2015-06-10 LAB — URINE DRUG SCREEN, QUALITATIVE (ARMC ONLY)
AMPHETAMINES, UR SCREEN: NOT DETECTED
BENZODIAZEPINE, UR SCRN: NOT DETECTED
Barbiturates, Ur Screen: NOT DETECTED
CANNABINOID 50 NG, UR ~~LOC~~: NOT DETECTED
Cocaine Metabolite,Ur ~~LOC~~: NOT DETECTED
MDMA (Ecstasy)Ur Screen: NOT DETECTED
Methadone Scn, Ur: NOT DETECTED
OPIATE, UR SCREEN: NOT DETECTED
PHENCYCLIDINE (PCP) UR S: NOT DETECTED
Tricyclic, Ur Screen: NOT DETECTED

## 2015-06-10 LAB — TROPONIN I: Troponin I: <0.03 ng/mL (ref <0.031)

## 2015-06-10 LAB — CBC WITH DIFFERENTIAL/PLATELET
BASOS PCT: 1 %
Basophils Absolute: 0 10*3/uL (ref 0–0.1)
EOS ABS: 0.1 10*3/uL (ref 0–0.7)
Eosinophils Relative: 2 %
HCT: 37.6 % — ABNORMAL LOW (ref 40.0–52.0)
HEMOGLOBIN: 13.2 g/dL (ref 13.0–18.0)
LYMPHS ABS: 1.3 10*3/uL (ref 1.0–3.6)
Lymphocytes Relative: 22 %
MCH: 30.1 pg (ref 26.0–34.0)
MCHC: 34.9 g/dL (ref 32.0–36.0)
MCV: 86 fL (ref 80.0–100.0)
Monocytes Absolute: 0.9 10*3/uL (ref 0.2–1.0)
Monocytes Relative: 14 %
NEUTROS PCT: 61 %
Neutro Abs: 3.8 10*3/uL (ref 1.4–6.5)
Platelets: 225 10*3/uL (ref 150–440)
RBC: 4.38 MIL/uL — AB (ref 4.40–5.90)
RDW: 15 % — ABNORMAL HIGH (ref 11.5–14.5)
WBC: 6.2 10*3/uL (ref 3.8–10.6)

## 2015-06-10 LAB — CK: Total CK: 63 U/L (ref 49–397)

## 2015-06-10 LAB — LIPASE, BLOOD: LIPASE: 34 U/L (ref 11–51)

## 2015-06-10 LAB — ETHANOL: Alcohol, Ethyl (B): <5 mg/dL (ref <5)

## 2015-06-10 MED ORDER — KETOROLAC TROMETHAMINE 30 MG/ML IJ SOLN
10.0000 mg | Freq: Once | INTRAMUSCULAR | Status: AC
  Administered 2015-06-10: 9.9 mg via INTRAVENOUS
  Filled 2015-06-10: qty 1

## 2015-06-10 MED ORDER — SODIUM CHLORIDE 0.9 % IV BOLUS (SEPSIS)
1000.0000 mL | Freq: Once | INTRAVENOUS | Status: AC
  Administered 2015-06-10: 1000 mL via INTRAVENOUS

## 2015-06-10 MED ORDER — ACETAMINOPHEN 500 MG PO TABS
ORAL_TABLET | ORAL | Status: AC
  Administered 2015-06-10: 1000 mg via ORAL
  Filled 2015-06-10: qty 2

## 2015-06-10 MED ORDER — ACETAMINOPHEN 500 MG PO TABS
1000.0000 mg | ORAL_TABLET | Freq: Once | ORAL | Status: AC
  Administered 2015-06-10: 1000 mg via ORAL
  Filled 2015-06-10: qty 2

## 2015-06-10 MED ORDER — LORAZEPAM 2 MG/ML IJ SOLN
0.5000 mg | Freq: Once | INTRAMUSCULAR | Status: AC
  Administered 2015-06-10: 0.5 mg via INTRAVENOUS
  Filled 2015-06-10: qty 1

## 2015-06-10 MED ORDER — ASPIRIN 81 MG PO CHEW: 324.0000 mg | CHEWABLE_TABLET | Freq: Once | ORAL | Status: DC

## 2015-06-10 NOTE — ED Notes (Signed)
Discharge instructions and follow up reviewed with pt and deputy.
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
## 2015-06-10 NOTE — Discharge Instructions (Signed)
Your chest pain does not appear to be coming from your heart tonight. Return to the ER for worsening symptoms, persistent vomiting, difficulty breathing or other concerns.  Nonspecific Chest Pain  Chest pain can be caused by many different conditions. There is always a chance that your pain could be related to something serious, such as a heart attack or a blood clot in your lungs. Chest pain can also be caused by conditions that are not life-threatening. If you have chest pain, it is very important to follow up with your health care provider. CAUSES  Chest pain can be caused by:  Heartburn.  Pneumonia or bronchitis.  Anxiety or stress.  Inflammation around your heart (pericarditis) or lung (pleuritis or pleurisy).  A blood clot in your lung.  A collapsed lung (pneumothorax). It can develop suddenly on its own (spontaneous pneumothorax) or from trauma to the chest.  Shingles infection (varicella-zoster virus).  Heart attack.  Damage to the bones, muscles, and cartilage that make up your chest wall. This can include:  Bruised bones due to injury.  Strained muscles or cartilage due to frequent or repeated coughing or overwork.  Fracture to one or more ribs.  Sore cartilage due to inflammation (costochondritis). RISK FACTORS  Risk factors for chest pain may include:  Activities that increase your risk for trauma or injury to your chest.  Respiratory infections or conditions that cause frequent coughing.  Medical conditions or overeating that can cause heartburn.  Heart disease or family history of heart disease.  Conditions or health behaviors that increase your risk of developing a blood clot.  Having had chicken pox (varicella zoster). SIGNS AND SYMPTOMS Chest pain can feel like:  Burning or tingling on the surface of your chest or deep in your chest.  Crushing, pressure, aching, or squeezing pain.  Dull or sharp pain that is worse when you move, cough, or take a  deep breath.  Pain that is also felt in your back, neck, shoulder, or arm, or pain that spreads to any of these areas. Your chest pain may come and go, or it may stay constant. DIAGNOSIS Lab tests or other studies may be needed to find the cause of your pain. Your health care provider may have you take a test called an ambulatory ECG (electrocardiogram). An ECG records your heartbeat patterns at the time the test is performed. You may also have other tests, such as:  Transthoracic echocardiogram (TTE). During echocardiography, sound waves are used to create a picture of all of the heart structures and to look at how blood flows through your heart.  Transesophageal echocardiogram (TEE).This is a more advanced imaging test that obtains images from inside your body. It allows your health care provider to see your heart in finer detail.  Cardiac monitoring. This allows your health care provider to monitor your heart rate and rhythm in real time.  Holter monitor. This is a portable device that records your heartbeat and can help to diagnose abnormal heartbeats. It allows your health care provider to track your heart activity for several days, if needed.  Stress tests. These can be done through exercise or by taking medicine that makes your heart beat more quickly.  Blood tests.  Imaging tests. TREATMENT  Your treatment depends on what is causing your chest pain. Treatment may include:  Medicines. These may include:  Acid blockers for heartburn.  Anti-inflammatory medicine.  Pain medicine for inflammatory conditions.  Antibiotic medicine, if an infection is present.  Medicines to  dissolve blood clots.  Medicines to treat coronary artery disease.  Supportive care for conditions that do not require medicines. This may include:  Resting.  Applying heat or cold packs to injured areas.  Limiting activities until pain decreases. HOME CARE INSTRUCTIONS  If you were prescribed an  antibiotic medicine, finish it all even if you start to feel better.  Avoid any activities that bring on chest pain.  Do not use any tobacco products, including cigarettes, chewing tobacco, or electronic cigarettes. If you need help quitting, ask your health care provider.  Do not drink alcohol.  Take medicines only as directed by your health care provider.  Keep all follow-up visits as directed by your health care provider. This is important. This includes any further testing if your chest pain does not go away.  If heartburn is the cause for your chest pain, you may be told to keep your head raised (elevated) while sleeping. This reduces the chance that acid will go from your stomach into your esophagus.  Make lifestyle changes as directed by your health care provider. These may include:  Getting regular exercise. Ask your health care provider to suggest some activities that are safe for you.  Eating a heart-healthy diet. A registered dietitian can help you to learn healthy eating options.  Maintaining a healthy weight.  Managing diabetes, if necessary.  Reducing stress. SEEK MEDICAL CARE IF:  Your chest pain does not go away after treatment.  You have a rash with blisters on your chest.  You have a fever. SEEK IMMEDIATE MEDICAL CARE IF:   Your chest pain is worse.  You have an increasing cough, or you cough up blood.  You have severe abdominal pain.  You have severe weakness.  You faint.  You have chills.  You have sudden, unexplained chest discomfort.  You have sudden, unexplained discomfort in your arms, back, neck, or jaw.  You have shortness of breath at any time.  You suddenly start to sweat, or your skin gets clammy.  You feel nauseous or you vomit.  You suddenly feel light-headed or dizzy.  Your heart begins to beat quickly, or it feels like it is skipping beats. These symptoms may represent a serious problem that is an emergency. Do not wait to  see if the symptoms will go away. Get medical help right away. Call your local emergency services (911 in the [place] Do not drive yourself to the hospital.   This information is not intended to replace advice given to you by your health care provider. Make sure you discuss any questions you have with your health care provider.   Document Released: 11/19/2004 Document Revised: 03/02/2014 Document Reviewed: 09/15/2013 Elsevier Interactive Patient Education [employer].

## 2015-06-10 NOTE — ED Notes (Signed)
Pt developed intermittent CP. Pt given 324 ASA and 1 nitro en route with EMS. Pt tachycardic with EMS [id number].

## 2015-06-10 NOTE — ED Notes (Signed)
Pt. Given drink and pb and crackers.

## 2015-06-10 NOTE — ED Notes (Signed)
Pt arrives to ER c/o CP that began 06/09/15 at 1400 while reading. Pt hx of CP in the past, 2 heart surgeries per his report. Pt states CP accompanied with SOB. Denies NV, diaphoresis. Pt appears in NAD, color WNL, RR even and unlabored.

## 2015-06-10 NOTE — ED Provider Notes (Signed)
[facility] Emergency Department Provider Note  ____________________________________________  Time seen: Approximately 12:11 AM  I have reviewed the triage vital signs and the nursing notes.   HISTORY  Chief Complaint Chest pain   HPI Joseph Pierce is a 60 y.o. male [facility] with a chief complaint of chest pain. Patient has a history of bipolar disorder and schizophrenia who reports he has had 2 cardiac catheterizations within the past 12 years without intervention.Reports onset of left chest pain approximately 3 PM while reading a book. Describes intermittent sharp, stabbing pain lasting a few seconds each time. Symptoms are not associated with diaphoresis, shortness of breath, nausea, vomiting or palpitations. Denies recent fever, chills, cough, congestion, abdominal pain, diarrhea. Denies recent travel or trauma. Denies recent drug use. Nothing makes his symptoms better or worse. Received nitroglycerin and aspirin prior to arrival without relief of symptoms.   Past Medical History  Diagnosis Date  . Chest pain   . Bipolar affective (HCC)   . Schizophrenia (HCC)   . ADHD (attention deficit hyperactivity disorder)     There are no active problems to display for this patient.   Past Surgical History  Procedure Laterality Date  . Hernia repair    . Faical reconstruction    . Cardiac stents      Current Outpatient Rx  Name  Route  Sig  Dispense  Refill  . ibuprofen (ADVIL,MOTRIN) 800 MG tablet   Oral   Take 1 tablet (800 mg total) by mouth every 8 (eight) hours as needed.   30 tablet   0     Allergies Penicillins  Family history CAD  Social History Social History  Substance Use Topics  . Smoking status: Former Games developer  . Smokeless tobacco: None  . Alcohol Use: No    Review of Systems  Constitutional: No fever/chills. Eyes: No visual changes. ENT: No sore throat. Cardiovascular: Positive for chest  pain. Respiratory: Denies shortness of breath. Gastrointestinal: No abdominal pain.  No nausea, no vomiting.  No diarrhea.  No constipation. Genitourinary: Negative for dysuria. Musculoskeletal: Negative for back pain. Skin: Negative for rash. Neurological: Negative for headaches, focal weakness or numbness. Psychiatric:Positive for anxiety. Negative for SI/HI/AH/VH.  10-point ROS otherwise negative.  ____________________________________________   PHYSICAL EXAM:  VITAL SIGNS: ED Triage Vitals  Enc Vitals Group     BP --      Pulse --      Resp --      Temp --      Temp src --      SpO2 --      Weight --      Height --      Head Cir --      Peak Flow --      Pain Score --      Pain Loc --      Pain Edu? --      Excl. in GC? --     Constitutional: Alert and oriented. Well appearing and in no acute distress. Mildly anxious. Eyes: Conjunctivae are normal. PERRL. EOMI. Head: Atraumatic. Nose: No congestion/rhinnorhea. Mouth/Throat: Mucous membranes are moist.  Oropharynx non-erythematous. Neck: No stridor.  No carotid bruits. Cardiovascular: Normal rate, regular rhythm. Grossly normal heart sounds.  Good peripheral circulation. Respiratory: Normal respiratory effort.  No retractions. Lungs CTAB. Gastrointestinal: Soft and nontender. No distention. No abdominal bruits. No CVA tenderness. Musculoskeletal: No lower extremity tenderness nor edema.  No joint effusions. Neurologic:  Normal  speech and language. No gross focal neurologic deficits are appreciated. No gait instability. Skin:  Skin is warm, dry and intact. No rash noted. Psychiatric: Mood and affect are normal. Speech and behavior are normal.  ____________________________________________   LABS (all labs ordered are listed, but only abnormal results are displayed)  Labs Reviewed  CBC WITH DIFFERENTIAL/PLATELET - Abnormal; Notable for the following:    RBC 4.38 (*)    HCT 37.6 (*)    RDW 15.0 (*)    All  other components within normal limits  COMPREHENSIVE METABOLIC PANEL - Abnormal; Notable for the following:    Glucose, Bld 120 (*)    Calcium 8.6 (*)    ALT 13 (*)    Anion gap 3 (*)    All other components within normal limits  ETHANOL  LIPASE, BLOOD  TROPONIN I  URINE DRUG SCREEN, QUALITATIVE ([facility] ONLY)  [facility]   ____________________________________________  EKG  ED ECG REPORT I, Angelise Petrich J, the attending physician, personally viewed and interpreted this ECG.   Date: 06/10/2015  EKG Time: 0017  Rate: 96  Rhythm: normal EKG, normal sinus rhythm  Axis: Normal  Intervals:none  ST&T Change: Nonspecific  ____________________________________________  RADIOLOGY  Chest 2 view (viewed by me, interpreted per Dr. Phill MyronMcClintock): No active cardiac disease. ____________________________________________   PROCEDURES  Procedure(s) performed: None  Critical Care performed: No  ____________________________________________   INITIAL IMPRESSION / ASSESSMENT AND PLAN / ED COURSE  Pertinent labs & imaging results that were available during my care of the patient were reviewed by me and considered in my medical decision making (see chart for details).  60 year old male brought from jail with the chief complaint of atypical chest pain. Will obtain screening lab work including troponin, at [facility] and urine drug screen given his prior history of substance abuse.  ----------------------------------------- 3:29 AM on 06/10/2015 -----------------------------------------  Patient is overall feeling better; occasional twinges of pain for which he physically winces. Low suspicion for ACS, PE, aortic dissection. Will refer patient for outpatient cardiology follow-up. Strict return precautions given. Patient verbalizes understanding as appropriate. ____________________________________________   FINAL CLINICAL IMPRESSION(S) / ED DIAGNOSES  Final diagnoses:  Chest pain, unspecified chest pain  type      Irean HongJade J Kegan Shepardson, MD 06/10/15 0840

## 2015-06-10 NOTE — ED Notes (Signed)
Pt. States he had two heart catheterizations at [facility] 10-12 years ago, unsure if he had stents placed.

## 2017-03-09 IMAGING — DX DG CHEST 1V PORT
1 series · 1 of 1 positions shown · non-contrast
Comparison: Chest x-ray 04/05/2008.

CLINICAL DATA: 59-year-old male with history of chest pain today.

EXAM:
PORTABLE CHEST 1 VIEW

[chest ap]
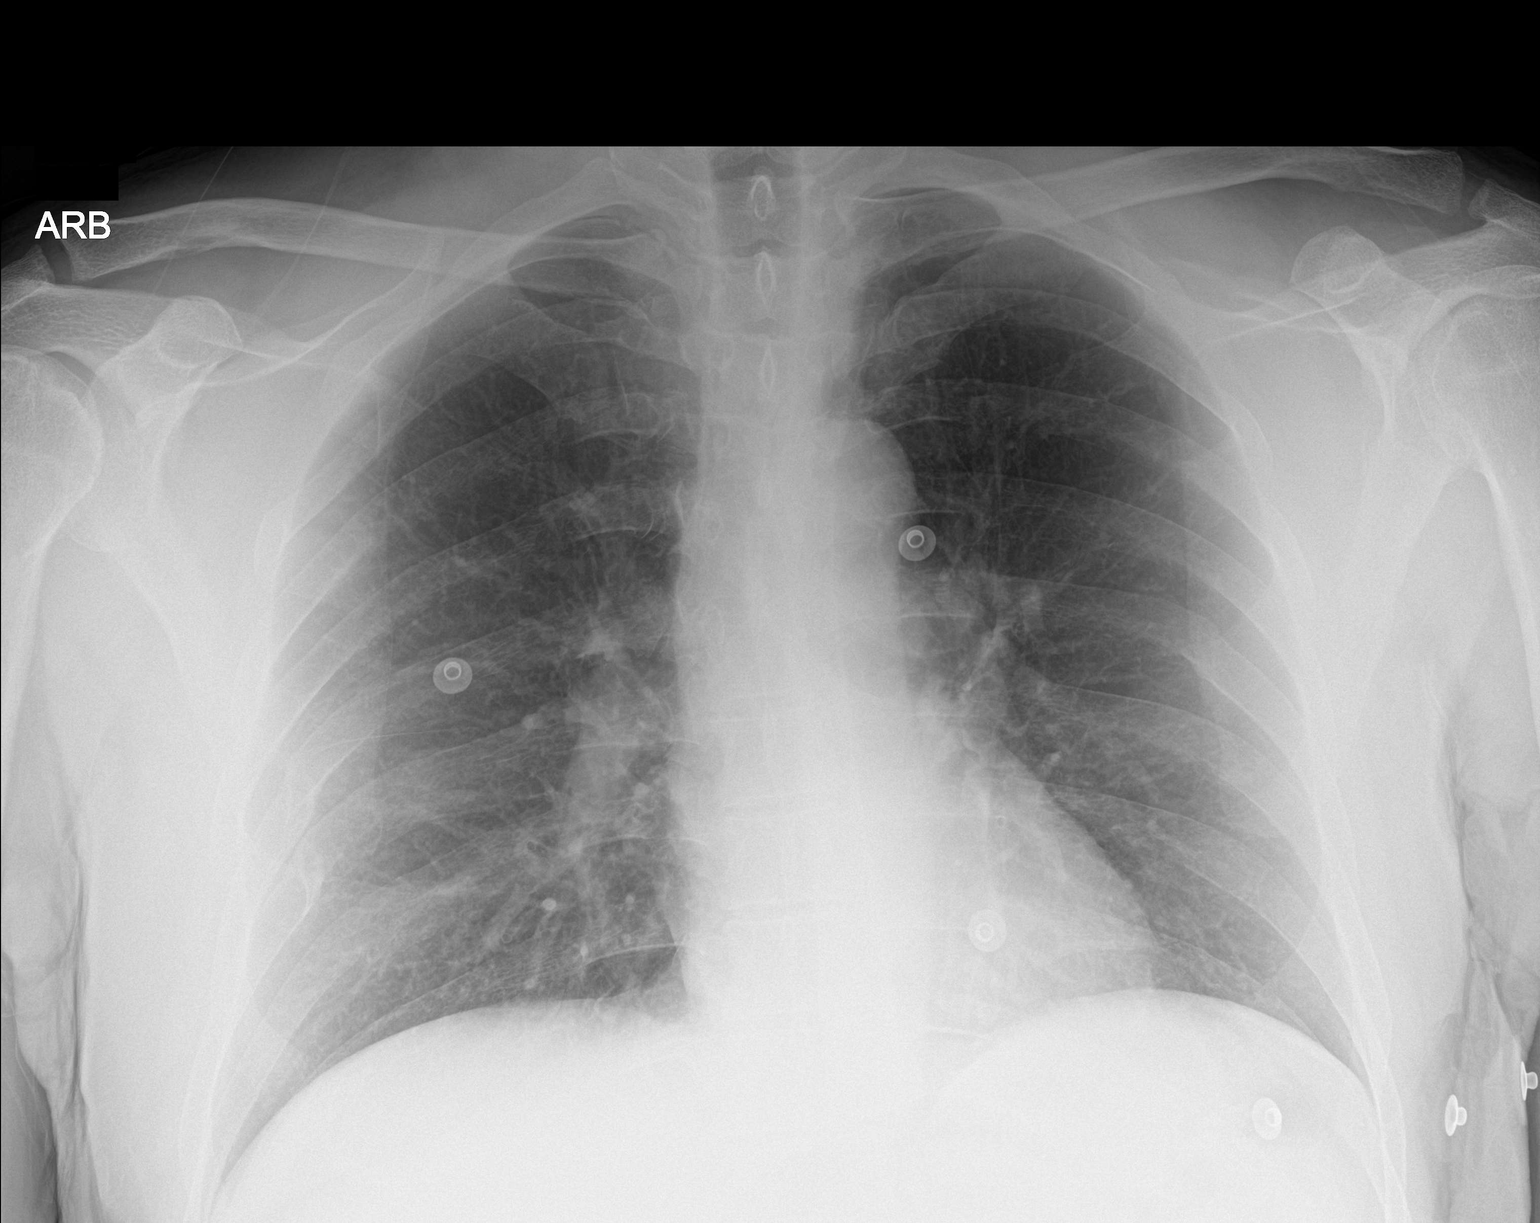

[1 of 1 positions shown; findings below may reference images not displayed]

FINDINGS: Lung volumes are normal. No consolidative airspace disease. No
pleural effusions. No pneumothorax. No pulmonary nodule or mass
noted. Pulmonary vasculature and the cardiomediastinal silhouette
are within normal limits.
IMPRESSION: No radiographic evidence of acute cardiopulmonary disease.

## 2017-04-14 IMAGING — CR DG CHEST 2V
1 series · 2 of 2 positions shown · non-contrast
Comparison: Prior radiograph 05/05/2015.

CLINICAL DATA: Initial evaluation for acute chest pain with
shortness of breath.

EXAM:
CHEST  2 VIEW

[Series 1: dg chest 2 view · 0.14mm/px · 2 of 2 slices shown]
[im 1/2]
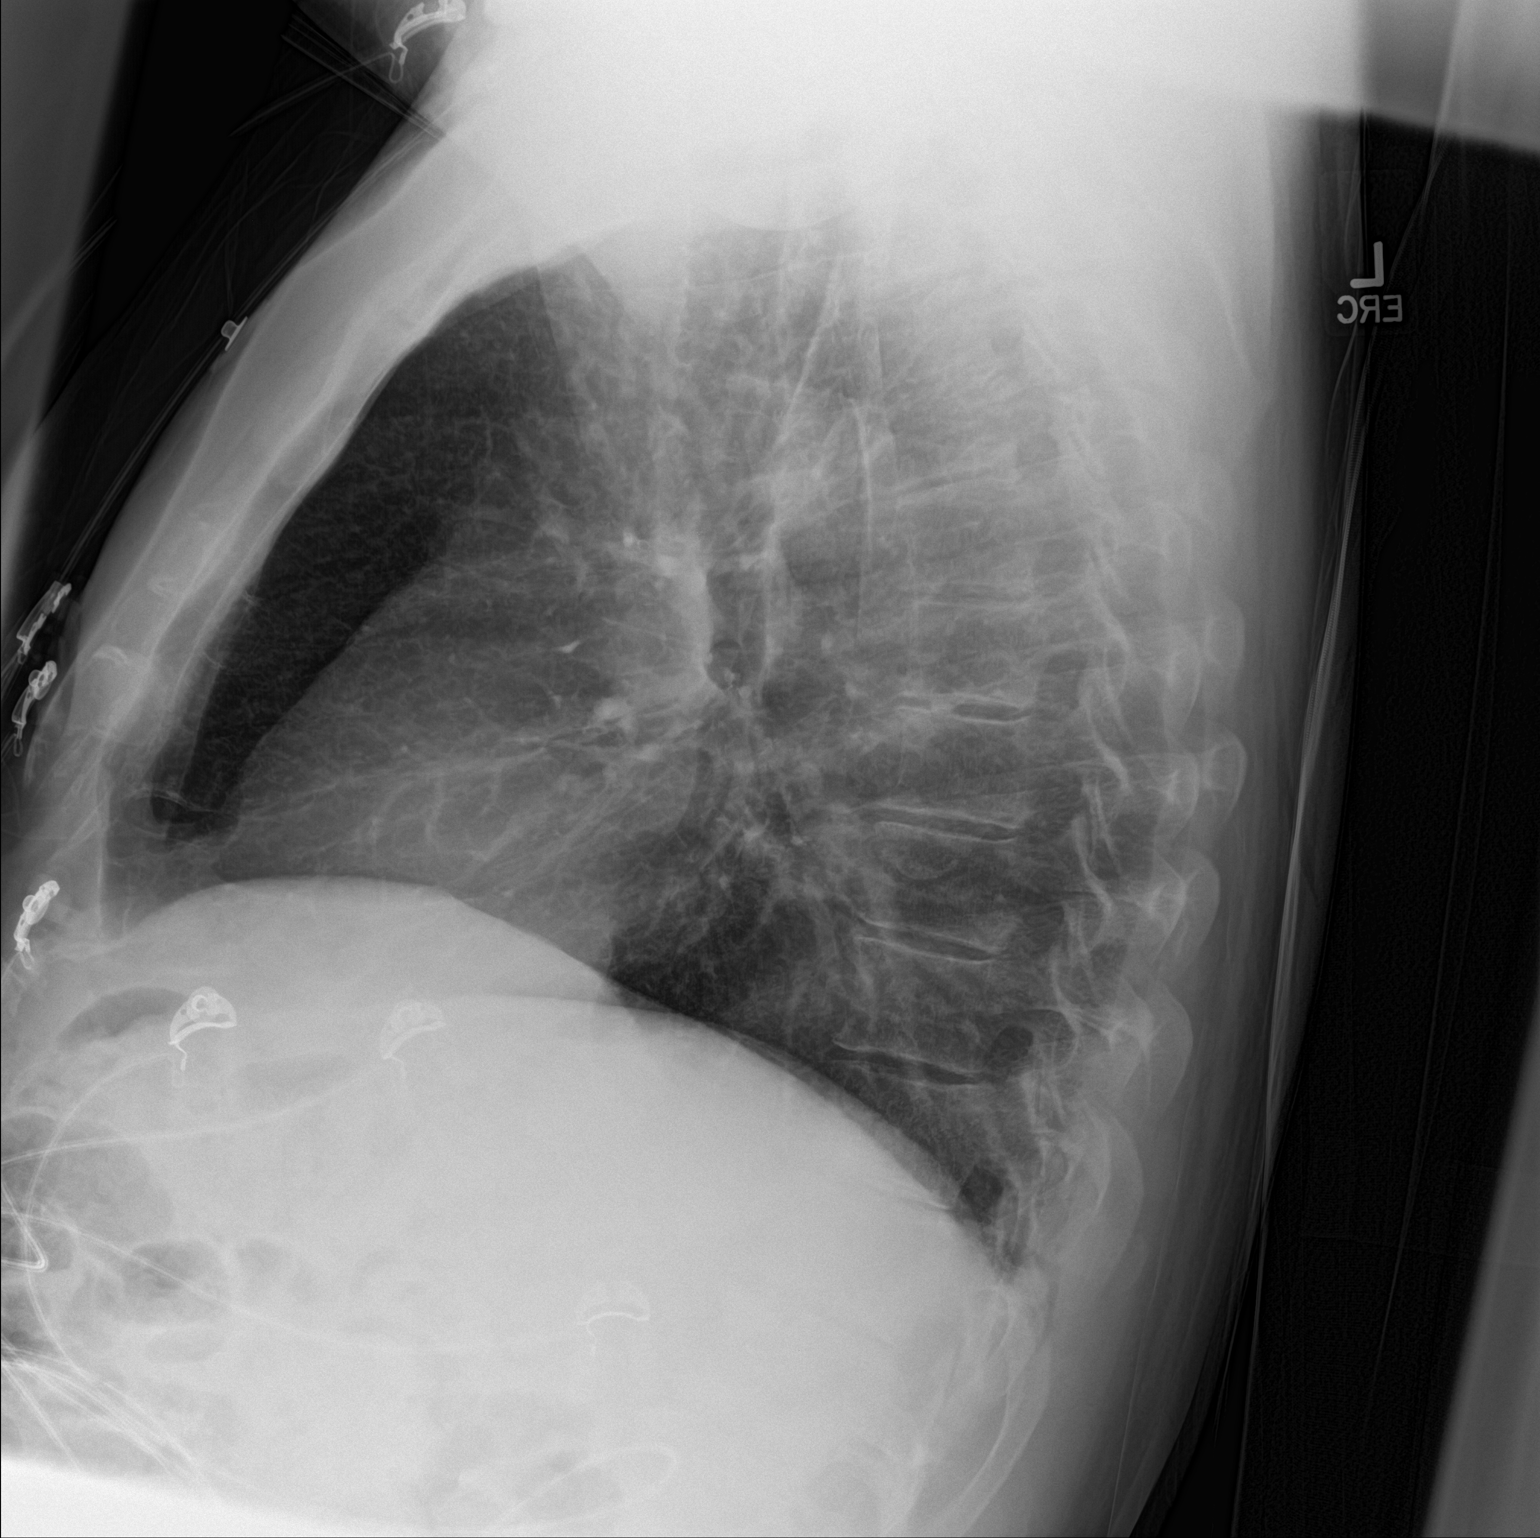
[im 2/2]
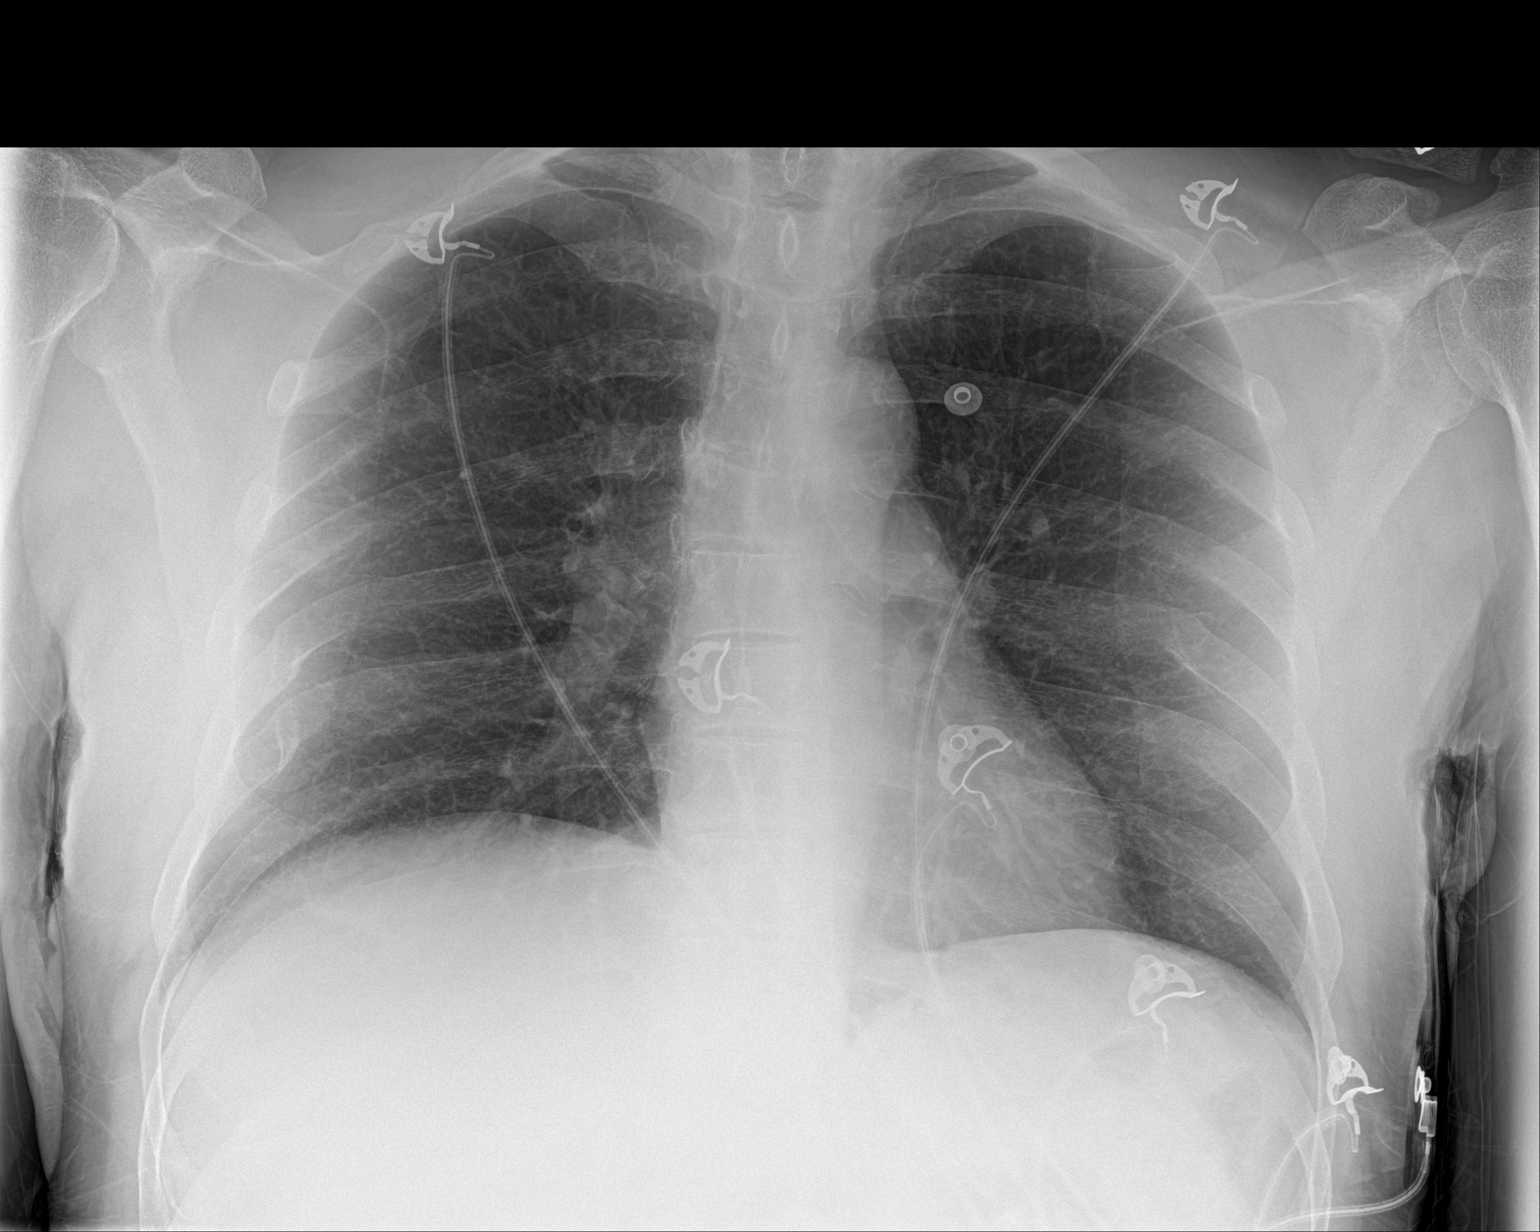

[2 of 2 positions shown; findings below may reference images not displayed]

FINDINGS: Cardiac and mediastinal silhouettes are stable in size and contour,
and remain within normal limits. Tracheal air column midline and
patent.

Lungs are normally inflated. No focal infiltrate, pulmonary edema,
or pleural effusion. No pneumothorax.

Remotely healed right-sided rib fractures noted, stable. No acute
osseous abnormality.
IMPRESSION: No active cardiopulmonary disease.

## 2019-06-12 ENCOUNTER — Ambulatory Visit
Admit: 2019-06-12 | Discharge: 2019-06-12 | Payer: PRIVATE HEALTH INSURANCE | Attending: Family Medicine | Primary: Family Medicine

## 2019-06-12 ENCOUNTER — Encounter

## 2019-06-12 ENCOUNTER — Encounter: Payer: PRIVATE HEALTH INSURANCE | Attending: Family Medicine | Primary: Family Medicine

## 2019-06-12 DIAGNOSIS — Z0000 Encounter for general adult medical examination without abnormal findings: Secondary | ICD-10-CM

## 2019-06-12 MED ORDER — LEVOTHYROXINE SODIUM 150 MCG PO TABS
150 MCG | ORAL_TABLET | Freq: Every day | ORAL | 0 refills | Status: DC
Start: 2019-06-12 — End: 2019-09-04

## 2019-06-12 MED ORDER — ATORVASTATIN CALCIUM 80 MG PO TABS
80 MG | ORAL_TABLET | Freq: Every day | ORAL | 0 refills | Status: DC
Start: 2019-06-12 — End: 2019-09-04

## 2019-06-12 MED ORDER — METOPROLOL TARTRATE 25 MG PO TABS
25 MG | ORAL_TABLET | Freq: Two times a day (BID) | ORAL | 1 refills | Status: DC
Start: 2019-06-12 — End: 2019-07-07

## 2019-06-12 MED ORDER — PREDNISONE 10 MG PO TABS
10 MG | ORAL_TABLET | Freq: Two times a day (BID) | ORAL | 0 refills | Status: AC
Start: 2019-06-12 — End: ?

## 2019-06-12 MED ORDER — NICOTINE 7 MG/24HR TD PT24
7 MG/24HR | MEDICATED_PATCH | TRANSDERMAL | 3 refills | Status: AC
Start: 2019-06-12 — End: 2020-06-11

## 2019-06-12 MED ORDER — PANTOPRAZOLE SODIUM 20 MG PO TBEC
20 MG | ORAL_TABLET | Freq: Every day | ORAL | 1 refills | Status: DC
Start: 2019-06-12 — End: 2019-06-15

## 2019-06-13 ENCOUNTER — Encounter

## 2019-06-13 LAB — CBC WITH AUTO DIFFERENTIAL
Basophils %: 1.2 %
Basophils Absolute: 0.1 10*3/uL (ref 0.0–0.2)
Eosinophils %: 2.3 %
Eosinophils Absolute: 0.1 10*3/uL (ref 0.0–0.6)
Hematocrit: 35.9 % — ABNORMAL LOW (ref 40.5–52.5)
Hemoglobin: 11.8 g/dL — ABNORMAL LOW (ref 13.5–17.5)
Lymphocytes %: 45.9 %
Lymphocytes Absolute: 2 10*3/uL (ref 1.0–5.1)
MCH: 29.5 pg (ref 26.0–34.0)
MCHC: 32.9 g/dL (ref 31.0–36.0)
MCV: 89.7 fL (ref 80.0–100.0)
MPV: 8.1 fL (ref 5.0–10.5)
Monocytes %: 9.2 %
Monocytes Absolute: 0.4 10*3/uL (ref 0.0–1.3)
Neutrophils %: 41.4 %
Neutrophils Absolute: 1.8 10*3/uL (ref 1.7–7.7)
Platelets: 267 10*3/uL (ref 135–450)
RBC: 4 M/uL — ABNORMAL LOW (ref 4.20–5.90)
RDW: 15.9 % — ABNORMAL HIGH (ref 12.4–15.4)
WBC: 4.4 10*3/uL (ref 4.0–11.0)

## 2019-06-13 LAB — IRON AND TIBC
Iron Saturation: 18 % — ABNORMAL LOW (ref 20–50)
Iron: 61 ug/dL (ref 59–158)
TIBC: 332 ug/dL (ref 260–445)

## 2019-06-13 LAB — COMPREHENSIVE METABOLIC PANEL
ALT: 18 U/L (ref 10–40)
AST: 21 U/L (ref 15–37)
Albumin/Globulin Ratio: 1.6 (ref 1.1–2.2)
Albumin: 4.1 g/dL (ref 3.4–5.0)
Alkaline Phosphatase: 63 U/L (ref 40–129)
Anion Gap: 9 (ref 3–16)
BUN: 21 mg/dL — ABNORMAL HIGH (ref 7–20)
CO2: 28 mmol/L (ref 21–32)
Calcium: 8.9 mg/dL (ref 8.3–10.6)
Chloride: 104 mmol/L (ref 99–110)
Creatinine: 0.9 mg/dL (ref 0.8–1.3)
GFR African American: >60 (ref >60)
GFR Non-African American: >60 (ref >60)
Globulin: 2.6 g/dL
Glucose: 106 mg/dL — ABNORMAL HIGH (ref 70–99)
Potassium: 4.3 mmol/L (ref 3.5–5.1)
Sodium: 141 mmol/L (ref 136–145)
Total Bilirubin: 0.4 mg/dL (ref 0.0–1.0)
Total Protein: 6.7 g/dL (ref 6.4–8.2)

## 2019-06-13 LAB — LIPID PANEL
Cholesterol, Total: 149 mg/dL (ref 0–199)
HDL: 58 mg/dL (ref 40–60)
LDL Calculated: 73 mg/dL (ref <100)
Triglycerides: 89 mg/dL (ref 0–150)
VLDL Cholesterol Calculated: 18 mg/dL

## 2019-06-13 LAB — HIV SCREEN
HIV ANTIGEN: NONREACTIVE
HIV Ag/Ab: NONREACTIVE
HIV-1 Antibody: NONREACTIVE
HIV-2 Ab: NONREACTIVE

## 2019-06-13 LAB — HEMOGLOBIN A1C
Hemoglobin A1C: 6.2 %
eAG: 131.2 mg/dL

## 2019-06-13 LAB — T4, FREE: T4 Free: 1.4 ng/dL (ref 0.9–1.8)

## 2019-06-13 LAB — TSH: TSH: 0.9 u[IU]/mL (ref 0.27–4.20)

## 2019-06-13 LAB — FERRITIN: Ferritin: 21.4 ng/mL — ABNORMAL LOW (ref 30.0–400.0)

## 2019-06-15 ENCOUNTER — Encounter

## 2019-06-15 ENCOUNTER — Ambulatory Visit
Admit: 2019-06-15 | Discharge: 2019-06-15 | Payer: PRIVATE HEALTH INSURANCE | Attending: Family Medicine | Primary: Family Medicine

## 2019-06-15 DIAGNOSIS — R0782 Intercostal pain: Secondary | ICD-10-CM

## 2019-06-15 MED ORDER — LIDOCAINE 4 % EX PTCH
4 % | MEDICATED_PATCH | Freq: Every day | CUTANEOUS | 0 refills | Status: DC | PRN
Start: 2019-06-15 — End: 2019-08-01

## 2019-06-15 MED ORDER — PANTOPRAZOLE SODIUM 20 MG PO TBEC
20 MG | ORAL_TABLET | Freq: Every day | ORAL | 1 refills | Status: DC
Start: 2019-06-15 — End: 2019-08-01

## 2019-06-15 MED ORDER — ARTIFICIAL TEARS 0.1-0.3 % OP SOLN
0.1-0.3 | OPHTHALMIC | 0 refills | Status: AC | PRN
Start: 2019-06-15 — End: ?

## 2019-06-15 MED ORDER — FERROUS SULFATE 325 (65 FE) MG PO TABS
325 (65 Fe) MG | ORAL_TABLET | ORAL | 0 refills | Status: DC
Start: 2019-06-15 — End: 2019-07-10

## 2019-06-17 LAB — HEPATITIS C RNA, QUANTITATIVE, PCR
HCV QNT by NAAT IU/ML: NOT DETECTED IU/mL
HCV Qnt by NAAT log IU/ml: NOT DETECTED log IU/mL
Interpretation: NOT DETECTED

## 2019-07-04 ENCOUNTER — Ambulatory Visit
Admit: 2019-07-04 | Discharge: 2019-07-04 | Payer: PRIVATE HEALTH INSURANCE | Attending: Family Medicine | Primary: Family Medicine

## 2019-07-04 DIAGNOSIS — R079 Chest pain, unspecified: Secondary | ICD-10-CM

## 2019-07-04 NOTE — Telephone Encounter (Signed)
Received call from [employer] at pre[facility]) [place] with Red Flag Complaint.    Brief description of triage: Chest pain for about a year, had triple bypass surgery about 11 months ago. Last saw a doctor 2 weeks ago. States no new or worsening symptoms since he was last seen, but wants to do something about his chest pain and talk to the doctor about it.    Triage not needed - no new or worsening symptoms. Encouraged patient to make appointment and call back if anything changed or became worse.    Care advice provided, patient verbalizes understanding; denies any other questions or concerns; instructed to call back for any new or worsening symptoms.    Writer provided warm transfer to Ryah at [facility] for appointment scheduling.    Attention Provider:  Thank you for allowing me to participate in the care of your patient.  The patient was connected to triage in response to information provided to the E[facility].  Please do not respond through this encounter as the response is not directed to a shared pool.          Reason for Disposition  ??? Caller has already spoken with the PCP (or office), and has no further questions    Protocols used: NO CONTACT OR DUPLICATE CONTACT CALL-ADULT-[place]

## 2019-07-06 ENCOUNTER — Encounter

## 2019-07-06 NOTE — Telephone Encounter (Signed)
Medication:   Requested Prescriptions     Pending Prescriptions Disp Refills   ??? metoprolol tartrate (LOPRESSOR) 25 MG tablet [Pharmacy Med Name: METOPROLOL TARTRATE 25 MG TAB] 60 tablet 1     Sig: TAKE 1 TABLET BY MOUTH 2 TIMES DAILY     Last Filled:  06/12/19    Last appt: 07/04/2019   Next appt: Visit date not found    Last OARRS: No flowsheet data found.

## 2019-07-07 MED ORDER — METOPROLOL TARTRATE 25 MG PO TABS
25 MG | ORAL_TABLET | Freq: Two times a day (BID) | ORAL | 3 refills | Status: DC
Start: 2019-07-07 — End: 2019-10-16

## 2019-07-10 MED ORDER — FERROUS SULFATE 325 (65 FE) MG PO TABS
325 (65 Fe) MG | ORAL_TABLET | ORAL | 2 refills | Status: AC
Start: 2019-07-10 — End: 2019-10-08

## 2019-07-10 NOTE — Telephone Encounter (Signed)
Medication:   Requested Prescriptions     Pending Prescriptions Disp Refills   ??? ferrous sulfate (IRON 325) 325 (65 Fe) MG tablet [Pharmacy Med Name: FERROUS SULFATE 325 MG TABLET] 15 tablet 2     Sig: TAKE 1 TABLET BY MOUTH EVERY OTHER DAY     Last Filled:  4.22.21    Last appt: 07/04/2019   Next appt: Visit date not found    Last OARRS: No flowsheet data found.

## 2019-07-13 ENCOUNTER — Encounter

## 2019-07-13 NOTE — Telephone Encounter (Signed)
Medication:   Requested Prescriptions     Pending Prescriptions Disp Refills   ??? predniSONE (DELTASONE) 10 MG tablet [Pharmacy Med Name: PREDNISONE 10 MG TABLET] 60 tablet 0     Sig: TAKE 1 TABLET BY MOUTH 2 TIMES DAILY     Last Filled:  4.19.21    Last appt: 07/04/2019   Next appt: Visit date not found    Last OARRS: No flowsheet data found.

## 2019-07-13 NOTE — Telephone Encounter (Signed)
Patient has established with a rheumatologist and should be getting their rheum meds from them.

## 2019-08-01 ENCOUNTER — Ambulatory Visit
Admit: 2019-08-01 | Discharge: 2019-08-01 | Payer: PRIVATE HEALTH INSURANCE | Attending: Family Medicine | Primary: Family Medicine

## 2019-08-01 DIAGNOSIS — R519 Headache, unspecified: Secondary | ICD-10-CM

## 2019-08-01 MED ORDER — CLONAZEPAM 1 MG PO TABS
1 MG | ORAL_TABLET | Freq: Two times a day (BID) | ORAL | 0 refills | Status: AC | PRN
Start: 2019-08-01 — End: 2019-08-31

## 2019-08-01 MED ORDER — LIDOCAINE 4 % EX PTCH
4 % | MEDICATED_PATCH | Freq: Every day | CUTANEOUS | 0 refills | Status: AC | PRN
Start: 2019-08-01 — End: 2019-08-31

## 2019-08-01 MED ORDER — NURTEC 75 MG PO TBDP
75 | ORAL_TABLET | ORAL | 0 refills | Status: AC
Start: 2019-08-01 — End: ?

## 2019-08-01 MED ORDER — PANTOPRAZOLE SODIUM 20 MG PO TBEC
20 MG | ORAL_TABLET | Freq: Every day | ORAL | 1 refills | Status: DC
Start: 2019-08-01 — End: 2019-09-07

## 2019-08-01 NOTE — Telephone Encounter (Signed)
Medication:   Requested Prescriptions     Pending Prescriptions Disp Refills   ??? lidocaine 4 % external patch 30 patch 0     Sig: Place 1 patch onto the skin daily as needed (chest wall pain)     Last Filled: 4.22.21    Last appt: 08/01/2019   Next appt: Visit date not found    Last OARRS: No flowsheet data found.

## 2019-08-05 LAB — DRUG PANEL-PM-HI RES-UR INTERP-A
6-Acetylmorphine: NOT DETECTED
7-aminoclonazepam: NOT DETECTED
Alpha-OH-Midazolam, Urine: NOT DETECTED
Barbiturates: NOT DETECTED
Benzoylecgonine: NOT DETECTED
CARISOPRODOL: NOT DETECTED
Clonazepam: NOT DETECTED
Codeine: NOT DETECTED
Creatinine, Ur: 192.5 mg/dL (ref 20.0–400.0)
Diazepam: NOT DETECTED
Fentanyl: NOT DETECTED
Gabapentin: NOT DETECTED
Hydrocodone: NOT DETECTED
Hydromorphone: NOT DETECTED
Lorazepam: NOT DETECTED
MDA: NOT DETECTED
MDEA: NOT DETECTED
MDMA, Urine: NOT DETECTED
Meperidine: NOT DETECTED
Methadone: NOT DETECTED
Methamphetamine: NOT DETECTED
Methylphenidate: NOT DETECTED
Midazolam: NOT DETECTED
Morphine: NOT DETECTED
Naloxone: NOT DETECTED
Nordiazepam: NOT DETECTED
Norfentanyl: NOT DETECTED
Norhydrocodone, Urine: NOT DETECTED
Noroxycodone: NOT DETECTED
Noroxymorphone, Urine: NOT DETECTED
OXAZEPAM: NOT DETECTED
Oxycodone: NOT DETECTED
Oxymorphone: NOT DETECTED
PCP: NOT DETECTED
Phentermine: NOT DETECTED
Pregabalin: NOT DETECTED
TEMAZEPAM: NOT DETECTED
Tapentadol, Urine: NOT DETECTED
Tapentadol-O-Sulfate, Urine: NOT DETECTED
Tramadol: NOT DETECTED
Zolpidem: NOT DETECTED

## 2019-08-24 ENCOUNTER — Encounter: Payer: PRIVATE HEALTH INSURANCE | Attending: "Endocrinology | Primary: Family Medicine

## 2019-08-24 NOTE — Progress Notes (Deleted)
Patient ID: Michael Campbell is a 64 y.o. male    Chief Complaint: hypothyroidism   Referred by  Dr. Cleon Dew, MD     HPI:   Michael Campbell is here for initial evaluation of hypothyroidism.     Takes Levothyroxine 150 mcg daily in morning empty stomach with water and waits for minutes.     Energy levels   Hair or skin changes   Weight   Heat cold intolerance   Depression or anxiety   Sweating, tremors, palpitations, sleep issues   Neck pain       Family history of thyroid disorder.    Neck radiation  Recent iodine loading in form of contrast material for diagnostic studies/cardiac cath  Food supplements, herbs or medications including Biotin  Recent URTI      The following portions of the patient's history were reviewed and updated as appropriate:       Family History   Problem Relation Age of Onset   ??? Cancer Mother    ??? Heart Attack Father    ??? Heart Attack Brother           Social History     Socioeconomic History   ??? Marital status: Unknown     Spouse name: Not on file   ??? Number of children: Not on file   ??? Years of education: Not on file   ??? Highest education level: Not on file   Occupational History   ??? Not on file   Tobacco Use   ??? Smoking status: Former Smoker     Packs/day: 1.00     Years: 15.00     Pack years: 15.00     Quit date: 2021     Years since quitting: 0.4   ??? Smokeless tobacco: Never Used   Vaping Use   ??? Vaping Use: Never assessed   Substance and Sexual Activity   ??? Alcohol use: Never   ??? Drug use: Never   ??? Sexual activity: Not Currently   Other Topics Concern   ??? Not on file   Social History Narrative   ??? Not on file     Social Determinants of Health     Financial Resource Strain: Low Risk    ??? Difficulty of Paying Living Expenses: Not hard at all   Food Insecurity: No Food Insecurity   ??? Worried About Programme researcher, broadcasting/film/video in the Last Year: Never true   ??? Ran Out of Food in the Last Year: Never true   Transportation Needs: No Transportation Needs   ??? Lack of Transportation (Medical): No   ???  Lack of Transportation (Non-Medical): No   Physical Activity:    ??? Days of Exercise per Week:    ??? Minutes of Exercise per Session:    Stress:    ??? Feeling of Stress :    Social Connections:    ??? Frequency of Communication with Friends and Family:    ??? Frequency of Social Gatherings with Friends and Family:    ??? Attends Religious Services:    ??? Database administrator or Organizations:    ??? Attends Engineer, structural:    ??? Marital Status:    Intimate Programme researcher, broadcasting/film/video Violence:    ??? Fear of Current or Ex-Partner:    ??? Emotionally Abused:    ??? Physically Abused:    ??? Sexually Abused:          Past Medical History:   Diagnosis Date   ???  Hiatal hernia    ??? Rheumatoid arthritis (HCC)          Past Surgical History:   Procedure Laterality Date   ??? AORTO-FEMORAL BYPASS GRAFT      triple bypass          Allergies   Allergen Reactions   ??? Penicillins Rash           Current Outpatient Medications:   ???  pantoprazole (PROTONIX) 20 MG tablet, Take 2 tablets by mouth daily, Disp: 90 tablet, Rfl: 1  ???  aspirin 81 MG EC tablet, Take 81 mg by mouth daily, Disp: , Rfl:   ???  clonazePAM (KLONOPIN) 1 MG tablet, Take 0.5 tablets by mouth 2 times daily as needed for Anxiety for up to 30 days., Disp: 30 tablet, Rfl: 0  ???  Rimegepant Sulfate (NURTEC) 75 MG TBDP, Take no more than one tablet daily as needed, Disp: 30 tablet, Rfl: 0  ???  lidocaine 4 % external patch, Place 1 patch onto the skin daily as needed (chest wall pain), Disp: 30 patch, Rfl: 0  ???  ferrous sulfate (IRON 325) 325 (65 Fe) MG tablet, TAKE 1 TABLET BY MOUTH EVERY OTHER DAY, Disp: 15 tablet, Rfl: 2  ???  metoprolol tartrate (LOPRESSOR) 25 MG tablet, TAKE 1 TABLET BY MOUTH 2 TIMES DAILY, Disp: 60 tablet, Rfl: 3  ???  dextran 70-hypromellose (ARTIFICIAL TEARS) 0.1-0.3 % SOLN opthalmic solution, Place 1 drop into both eyes every 4 hours as needed (dry eyes), Disp: 1 Bottle, Rfl: 0  ???  LORazepam (ATIVAN PO), Take by mouth, Disp: , Rfl:   ???  atorvastatin (LIPITOR) 80 MG tablet, Take  1 tablet by mouth daily, Disp: 90 tablet, Rfl: 0  ???  levothyroxine (SYNTHROID) 150 MCG tablet, Take 1 tablet by mouth Daily, Disp: 90 tablet, Rfl: 0  ???  predniSONE (DELTASONE) 10 MG tablet, Take 1 tablet by mouth 2 times daily, Disp: 60 tablet, Rfl: 0  ???  nicotine (NICODERM CQ) 7 MG/24HR, Place 1 patch onto the skin every 24 hours, Disp: 30 patch, Rfl: 3      Review of Systems:  Constitutional: Negative for fever, chills.   HENT: Negative for congestion, ear pain, rhinorrhea,  sore throat and trouble swallowing.   Eyes: Negative for photophobia, redness, itching.   Respiratory: Negative for cough, shortness of breath and sputum.   Cardiovascular: Negative for chest pain and leg swelling.   Gastrointestinal: Negative for nausea, vomiting, abdominal pain, diarrhea, constipation.   Endocrine: Negative for polydipsia, polyphagia and polyuria.   Genitourinary: Negative for dysuria, urgency, frequency, hematuria and flank pain.   Musculoskeletal: Negative for myalgias, back pain, arthralgias.   Skin/Nail: Negative for rash, itching. Normal nails.   Neurological: Negative for seizures, light-headedness, numbness and headaches.   Hematological/ Lymph nodes: Negative for adenopathy. Does not bruise/bleed easily.   Psychiatric/Behavioral: Negative for suicidal ideas and decreased concentration.          Physical Exam:  There were no vitals taken for this visit.  Constitutional: Patient is oriented to person, place, and time. Patient appears well-developed and well-nourished.   HENT:    Head: Normocephalic and atraumatic.     Eyes: Conjunctivae and EOM are normal. Pupils are equal, round, and reactive to light.    Neck: Normal range of motion.   Cardiovascular: Normal rate, regular rhythm and normal heart sounds.    Pulmonary/Chest: Effort normal and breath sounds normal.   Abdominal: Soft. Bowel sounds are normal.  Musculoskeletal: Normal range of motion.   Neurological: Patient is alert and oriented to person, place, and  time. Patient has normal reflexes.   Skin: Skin is warm and dry.   Psychiatric: Patient has a normal mood and affect. Patient behavior is normal.     Lab Review:    Office Visit on 08/01/2019   Component Date Value Ref Range Status   ??? Drugs Expected 08/01/2019 see attachment   Final   ??? Pain Management Drug Panel 08/01/2019 Inconsistent   Final   ??? Creatinine, Ur 08/01/2019 192.5  20.0 - 400.0 mg/dL Final   ??? Codeine 54/65/0354 Not Detected   Final   ??? Morphine 08/01/2019 Not Detected   Final   ??? 6-Acetylmorphine 08/01/2019 Not Detected   Final   ??? Oxycodone 08/01/2019 Not Detected   Final   ??? Noroxycodone 08/01/2019 Not Detected   Final   ??? Oxymorphone 08/01/2019 Not Detected   Final   ??? NOROXYMORPHONE, URINE 08/01/2019 Not Detected   Final   ??? Hydrocodone 08/01/2019 Not Detected   Final   ??? NORHYDROCODONE, URINE 08/01/2019 Not Detected   Final   ??? Hydromorphone 08/01/2019 Not Detected   Final   ??? Naloxone 08/01/2019 Not Detected   Final   ??? Buprenorphine 08/01/2019 Present   Final   ??? Norbuprenorphine 08/01/2019 Present   Final   ??? Fentanyl 08/01/2019 Not Detected   Final   ??? Norfentanyl 08/01/2019 Not Detected   Final   ??? Meperidine 08/01/2019 Not Detected   Final   ??? Tapentadol, Urine 08/01/2019 Not Detected   Final   ??? Tapentadol-O-Sulfate, Urine 08/01/2019 Not Detected   Final   ??? Methadone 08/01/2019 Not Detected   Final   ??? Tramadol 08/01/2019 Not Detected   Final   ??? Amphetamine 08/01/2019 Present   Final   ??? Methamphetamine 08/01/2019 Not Detected   Final   ??? MDMA, Urine 08/01/2019 Not Detected   Final   ??? MDA 08/01/2019 Not Detected   Final   ??? MDEA 08/01/2019 Not Detected   Final   ??? Methylphenidate 08/01/2019 Not Detected   Final   ??? Phentermine 08/01/2019 Not Detected   Final   ??? Benzoylecgonine 08/01/2019 Not Detected   Final   ??? Alprazolam 08/01/2019 Present   Final   ??? Alpha-OH-alprazolam 08/01/2019 Present   Final   ??? Clonazepam 08/01/2019 Not Detected   Final   ??? 7-aminoclonazepam 08/01/2019 Not  Detected   Final   ??? Diazepam 08/01/2019 Not Detected   Final   ??? NORDIAZEPAM 08/01/2019 Not Detected   Final   ??? OXAZEPAM 08/01/2019 Not Detected   Final   ??? TEMAZEPAM 08/01/2019 Not Detected   Final   ??? Lorazepam 08/01/2019 Not Detected   Final   ??? Midazolam 08/01/2019 Not Detected   Final   ??? Zolpidem 08/01/2019 Not Detected   Final   ??? Gabapentin 08/01/2019 Not Detected   Final   ??? Pregabalin 08/01/2019 Not Detected   Final   ??? Alpha-OH-Midazolam, Urine 08/01/2019 Not Detected   Final   ??? Barbiturates 08/01/2019 Not Detected   Final   ??? Ethyl Glucuronide 08/01/2019 Present   Final   ??? Marijuana Metabolite 08/01/2019 Present   Final   ??? PCP 08/01/2019 Not Detected   Final   ??? CARISOPRODOL 08/01/2019 Not Detected   Final   ??? Pain Management Drug Panel 08/01/2019 See Below   Final   ??? EER Pain Mgt Drug Panel, High Res/* 08/01/2019 See Note  Final   Orders Only on 06/15/2019   Component Date Value Ref Range Status   ??? HCV QNT by NAAT IU/ML 06/15/2019 Not Detected  IU/mL Final   ??? HCV Qnt by NAAT log IU/ml 06/15/2019 Not Detected  log IU/mL Final   ??? Interpretation 06/15/2019 Not Detected  Not Detected Final   Orders Only on 06/13/2019   Component Date Value Ref Range Status   ??? Iron 06/12/2019 61  59 - 158 ug/dL Final   ??? TIBC 16/11/9602 332  260 - 445 ug/dL Final   ??? Iron Saturation 06/12/2019 18* 20 - 50 % Final   ??? Ferritin 06/12/2019 21.4* 30.0 - 400.0 ng/mL Final   Orders Only on 06/12/2019   Component Date Value Ref Range Status   ??? WBC 06/12/2019 4.4  4.0 - 11.0 K/uL Final   ??? RBC 06/12/2019 4.00* 4.20 - 5.90 M/uL Final   ??? Hemoglobin 06/12/2019 11.8* 13.5 - 17.5 g/dL Final   ??? Hematocrit 06/12/2019 35.9* 40.5 - 52.5 % Final   ??? MCV 06/12/2019 89.7  80.0 - 100.0 fL Final   ??? MCH 06/12/2019 29.5  26.0 - 34.0 pg Final   ??? MCHC 06/12/2019 32.9  31.0 - 36.0 g/dL Final   ??? RDW 54/10/8117 15.9* 12.4 - 15.4 % Final   ??? Platelets 06/12/2019 267  135 - 450 K/uL Final   ??? MPV 06/12/2019 8.1  5.0 - 10.5 fL Final   ???  Neutrophils % 06/12/2019 41.4  % Final   ??? Lymphocytes % 06/12/2019 45.9  % Final   ??? Monocytes % 06/12/2019 9.2  % Final   ??? Eosinophils % 06/12/2019 2.3  % Final   ??? Basophils % 06/12/2019 1.2  % Final   ??? Neutrophils Absolute 06/12/2019 1.8  1.7 - 7.7 K/uL Final   ??? Lymphocytes Absolute 06/12/2019 2.0  1.0 - 5.1 K/uL Final   ??? Monocytes Absolute 06/12/2019 0.4  0.0 - 1.3 K/uL Final   ??? Eosinophils Absolute 06/12/2019 0.1  0.0 - 0.6 K/uL Final   ??? Basophils Absolute 06/12/2019 0.1  0.0 - 0.2 K/uL Final   ??? T4 Free 06/12/2019 1.4  0.9 - 1.8 ng/dL Final   ??? TSH 14/78/2956 0.90  0.27 - 4.20 uIU/mL Final   ??? HIV Ag/Ab 06/12/2019 Non-Reactive  Non-reactive Final   ??? HIV-1 Antibody 06/12/2019 Non-Reactive  Non-reactive Final   ??? HIV ANTIGEN 06/12/2019 Non-Reactive  Non-reactive Final   ??? HIV-2 Ab 06/12/2019 Non-Reactive  Non-reactive Final   ??? Sodium 06/12/2019 141  136 - 145 mmol/L Final   ??? Potassium 06/12/2019 4.3  3.5 - 5.1 mmol/L Final   ??? Chloride 06/12/2019 104  99 - 110 mmol/L Final   ??? CO2 06/12/2019 28  21 - 32 mmol/L Final   ??? Anion Gap 06/12/2019 9  3 - 16 Final   ??? Glucose 06/12/2019 106* 70 - 99 mg/dL Final   ??? BUN 21/30/8657 21* 7 - 20 mg/dL Final   ??? CREATININE 06/12/2019 0.9  0.8 - 1.3 mg/dL Final   ??? GFR Non-African American 06/12/2019 >60  >60 Final   ??? GFR African American 06/12/2019 >60  >60 Final   ??? Calcium 06/12/2019 8.9  8.3 - 10.6 mg/dL Final   ??? Total Protein 06/12/2019 6.7  6.4 - 8.2 g/dL Final   ??? Albumin 84/69/6295 4.1  3.4 - 5.0 g/dL Final   ??? Albumin/Globulin Ratio 06/12/2019 1.6  1.1 - 2.2 Final   ??? Total Bilirubin 06/12/2019 0.4  0.0 - 1.0 mg/dL Final   ??? Alkaline Phosphatase 06/12/2019 63  40 - 129 U/L Final   ??? ALT 06/12/2019 18  10 - 40 U/L Final   ??? AST 06/12/2019 21  15 - 37 U/L Final   ??? Globulin 06/12/2019 2.6  g/dL Final   ??? Cholesterol, Total 06/12/2019 149  0 - 199 mg/dL Final   ??? Triglycerides 06/12/2019 89  0 - 150 mg/dL Final   ??? HDL 16/11/9602 58  40 - 60 mg/dL Final   ???  LDL Calculated 06/12/2019 73  <100 mg/dL Final   ??? VLDL Cholesterol Calculated 06/12/2019 18  Not Established mg/dL Final   ??? Hemoglobin A1C 06/12/2019 6.2  See comment % Final   ??? eAG 06/12/2019 131.2  mg/dL Final        No results found.        Assessment/Plan:     There are no diagnoses linked to this encounter.         Education: Reviewed how to properly take thyroid replacement. Instructed to take daily with water on an empty stomach without concomitant vitamins or calcium or other medicine. Wait for 30-45 minutes before eating. If patient is confident they missed a day of pills, they can take the missed dose with their next tablet (only for levothyroxine).    Natural thyroid hormone use is not recommended by the American Thy[employer] Association. Natural thyroid hormone preparations consist of desiccated thyroid tissue from animals such as cows, sheep and pigs. These contain both thyroxine (T4) and triiodothyronine (T3) in a T4/T3 ratio of 4:1, the natural ratio made in animal thyroid glands.  Human thyroid glands, in contrast, make T4 and T3 in a ratio of 14:1. The liver and the kidneys contain a 5' deiodinase enzyme that converts T4 to T3 in appropriate and regulated amounts for the body's metabolic needs. Therefore, natural thyroid hormone preparations from animals provide significantly more T3 than humans naturally make. Since T3 is quickly and completely absorbed in the small intestine, patients taking these natural preparations often have supraphysiological serum T3 levels for several hours after taking the medications. These high serum T3 levels are potentially dangerous, particularly in older individuals who may already have underlying heart disease and/or osteoporosis. For these reasons natural thyroid hormone use is discouraged by most experts in the field.      Hyperthyroidism medication education   'Reviewed the risks of anti-thyroid drugs including agranulocytosis and hepatitis. If the patient develops  a fever, sore throat, jaundice, malaise and/or anorexia, patient is to stop the medicine and call ASAP.  Will do trial of methimazole for 9-12 months. If she has flare ups or continued methimazole for longer duration then will consider RAI or surgery.  Side effects of methimazole on fetus are cutis aplasia, congenital malformations, such as tracheoesophageal fistulas, patent vitellointestinal duct, choanal atresia, omphalocele, and omphalomesenteric duct anomaly.   Side effects of PTU on fetus are like methimazole but involve head and neck more often.   Risk of miscarriage is twice with hyperthyroidism     2: Graves eye disease   Raise the head of the bed   Use sun glasses   Use OTC artifical tears 3-4 times per day  Counseled for smoking cessation      Thyroid nodules are common, present in 50% of women and 33% of men. Risk of cancer is generally < 10%    I have explained the thyroid FNA procedure. The patient should get more information at  the time of procedure. FNA thyroid has False negative rates of about 3%. Non diagnostic rate of <10%. Will do genetic testing if needed.        Electronically signed by Cassell SmilesMUHAMMAD ADEEL Purva Vessell, MD on 08/24/2019 at 12:01 PM

## 2019-09-04 ENCOUNTER — Encounter

## 2019-09-04 MED ORDER — LEVOTHYROXINE SODIUM 150 MCG PO TABS
150 MCG | ORAL_TABLET | ORAL | 0 refills | Status: AC
Start: 2019-09-04 — End: ?

## 2019-09-04 MED ORDER — ATORVASTATIN CALCIUM 80 MG PO TABS
80 MG | ORAL_TABLET | ORAL | 0 refills | Status: AC
Start: 2019-09-04 — End: ?

## 2019-09-04 NOTE — Telephone Encounter (Signed)
Medication:   Requested Prescriptions     Pending Prescriptions Disp Refills   ??? levothyroxine (SYNTHROID) 150 MCG tablet [Pharmacy Med Name: LEVOTHYROXINE 150 MCG TABLET] 90 tablet 0     Sig: TAKE 1 TABLET BY MOUTH EVERY DAY   ??? atorvastatin (LIPITOR) 80 MG tablet [Pharmacy Med Name: ATORVASTATIN 80 MG TABLET] 90 tablet 0     Sig: TAKE 1 TABLET BY MOUTH EVERY DAY     Last Filled:  4.19.21     Last appt: 08/01/2019   Next appt: Visit date not found    Last Labs DM:   Lab Results   Component Value Date    LABA1C 6.2 06/12/2019     Last Lipid:   Lab Results   Component Value Date    CHOL 149 06/12/2019    TRIG 89 06/12/2019    HDL 58 06/12/2019    LDLCALC 73 06/12/2019     Last PSA: No results found for: PSA  Last Thyroid:   Lab Results   Component Value Date    TSH 0.90 06/12/2019    T4FREE 1.4 06/12/2019

## 2019-09-07 ENCOUNTER — Encounter

## 2019-09-07 MED ORDER — PANTOPRAZOLE SODIUM 20 MG PO TBEC
20 MG | ORAL_TABLET | ORAL | 1 refills | Status: AC
Start: 2019-09-07 — End: ?

## 2019-09-07 NOTE — Telephone Encounter (Signed)
Medication:   Requested Prescriptions     Pending Prescriptions Disp Refills   ??? pantoprazole (PROTONIX) 20 MG tablet [Pharmacy Med Name: PANTOPRAZOLE SOD DR 20 MG TAB] 90 tablet 1     Sig: TAKE 2 TABLETS BY MOUTH EVERY DAY        Last Filled:      Patient Phone Number: [phone] (home) [phone] (work)    Last appt: 08/01/2019   Next appt: Visit date not found    Last OARRS: No flowsheet data found.    Preferred Pharmacy:   [facility] [address] [place] - [address]etrius Char[address] [phone] - F [phone]  [address]  [place] [address]  Phone: [phone] Fax: [phone]

## 2019-10-14 ENCOUNTER — Encounter

## 2019-10-16 MED ORDER — METOPROLOL TARTRATE 25 MG PO TABS
25 MG | ORAL_TABLET | Freq: Two times a day (BID) | ORAL | 1 refills | Status: AC
Start: 2019-10-16 — End: ?

## 2019-10-16 NOTE — Telephone Encounter (Signed)
Medication:   Requested Prescriptions     Pending Prescriptions Disp Refills   ??? metoprolol tartrate (LOPRESSOR) 25 MG tablet [Pharmacy Med Name: METOPROLOL TARTRATE 25 MG TAB] 180 tablet 1     Sig: TAKE 1 TABLET BY MOUTH 2 TIMES DAILY     Last Filled:  5.14.21    Last appt: 08/01/2019   Next appt: Visit date not found    Last OARRS: No flowsheet data found.
